# Patient Record
Sex: Male | Born: 1990 | Race: White | Hispanic: No | Marital: Single | Smoking: Current every day smoker
Health system: Southern US, Community
[De-identification: ages and names within clinical notes are randomized; demographics above are authoritative.]

## PROBLEM LIST (undated history)

## (undated) DIAGNOSIS — N451 Epididymitis: Secondary | ICD-10-CM

## (undated) DIAGNOSIS — J45909 Unspecified asthma, uncomplicated: Secondary | ICD-10-CM

## (undated) DIAGNOSIS — F32A Depression, unspecified: Secondary | ICD-10-CM

## (undated) DIAGNOSIS — F419 Anxiety disorder, unspecified: Secondary | ICD-10-CM

## (undated) DIAGNOSIS — F1010 Alcohol abuse, uncomplicated: Secondary | ICD-10-CM

## (undated) DIAGNOSIS — F329 Major depressive disorder, single episode, unspecified: Secondary | ICD-10-CM

## (undated) HISTORY — PX: NASAL SEPTUM SURGERY: SHX37

## (undated) HISTORY — DX: Anxiety disorder, unspecified: F41.9

---
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
## 2009-06-29 ENCOUNTER — Emergency Department (HOSPITAL_COMMUNITY): Admission: EM | Admit: 2009-06-29 | Discharge: 2009-06-30 | Payer: Self-pay | Admitting: Emergency Medicine

## 2009-06-30 ENCOUNTER — Ambulatory Visit: Payer: Self-pay | Admitting: Psychiatry

## 2009-06-30 ENCOUNTER — Inpatient Hospital Stay (HOSPITAL_COMMUNITY): Admission: AD | Admit: 2009-06-30 | Discharge: 2009-07-04 | Payer: Self-pay | Admitting: Psychiatry

## 2010-07-11 LAB — BASIC METABOLIC PANEL
CO2: 30 mEq/L (ref 19–32)
GFR calc Af Amer: >60 mL/min (ref >60)
Glucose, Bld: 62 mg/dL — ABNORMAL LOW (ref 70–99)
Potassium: 4 mEq/L (ref 3.5–5.1)
Sodium: 140 mEq/L (ref 135–145)

## 2010-07-11 LAB — DIFFERENTIAL
Basophils Absolute: 0 10*3/uL (ref 0.0–0.1)
Basophils Relative: 0 % (ref 0–1)
Eosinophils Relative: 4 % (ref 0–5)
Lymphocytes Relative: 39 % (ref 12–46)
Monocytes Absolute: 0.6 10*3/uL (ref 0.1–1.0)
Monocytes Relative: 7 % (ref 3–12)

## 2010-07-11 LAB — ETHANOL: Alcohol, Ethyl (B): <5 mg/dL (ref 0–10)

## 2010-07-11 LAB — TRICYCLICS SCREEN, URINE: TCA Scrn: NOT DETECTED

## 2010-07-11 LAB — CBC
HCT: 43.4 % (ref 39.0–52.0)
Hemoglobin: 14.1 g/dL (ref 13.0–17.0)
MCHC: 32.5 g/dL (ref 30.0–36.0)
RBC: 4.58 MIL/uL (ref 4.22–5.81)
RDW: 13.4 % (ref 11.5–15.5)

## 2010-07-11 LAB — RAPID URINE DRUG SCREEN, HOSP PERFORMED
Amphetamines: NOT DETECTED
Barbiturates: NOT DETECTED
Benzodiazepines: POSITIVE — AB
Cocaine: POSITIVE — AB
Opiates: NOT DETECTED
Tetrahydrocannabinol: NOT DETECTED

## 2010-09-03 NOTE — Op Note (Signed)
NAME:  Robert Dougherty, Robert Dougherty                ACCOUNT NO.:  [id number]   MEDICAL RECORD NO.:  [id number]          PATIENT TYPE:  AMB   LOCATION:  DSC                          FACILITY:  [facility]   PHYSICIAN:  Christopher E. 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   OPERATION PERFORMED:  Septoplasty and bilateral inferior turbinate  reduction.   SURGEON:  Kristine Garbe. Ezzard Standing, M.D.   ANESTHESIA:  General endotracheal.   COMPLICATIONS:  None.   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  On examination, he has deviation of the septum to the right with a large  septal spur on the right side.  Has large turbinates.  Nasal passages  revealed no polyps or obstructing lesions.  The patient was taken to the  operating room at this time for septoplasty and turbinate reductions.   DESCRIPTION OF PROCEDURE:  After adequate endotracheal anesthesia, the nose  was prepped with cotton pledgets soaked in Afrin for decongestion and the  septum and turbinates were injected with Xylocaine with epinephrine for  local hemostasis.  Nose was prepped with Betadine solution and draped out  with sterile towels.  A hemitransfixion incision was made along the  cartilage of the septum on the right side.  Mucoperiosteal flaps were  elevated posteriorly.  The patient had a protrusion of the bone to the right  along with cartilage protruding off the crest into the right airway. This  protrusion was removed and allowed the right passageway to open up much  more.  This completed the septoplasty portion of  the procedure.  Following  this, turbinate reductions were performed.  On the right side, the inferior  one half of the turbinate was amputated and the remaining turbinate tissue  was outfractured and suction cautery was used for hemostasis.  On the left  side an incision was made along the inferior edge of the turbinate.  Mucoperichondrial flaps were elevated off the turbinate bone and then the  turbinate bone was removed and the remaining turbinate tissue was  outfractured and cauterized.  Submucosal bipolar cautery was used on the  left side.  This completed the procedure.  The hemitransfixion incision was  closed with interrupted 4-0 chromic sutures, the septum was basted with a 4-  0 chromic suture.  The nose was then packed with Telfa soaked in bacitracin  ointment.  This completed the procedure. Robert Dougherty was awakened from anesthesia  and transferred to recovery room postoperatively doing well.  Of note, he  received 1 g Ancef IV preoperatively.   DISPOSITION:  Robert Dougherty is discharged to home later this morning on Keflex 500 mg  twice daily for one week, Tylenol and Tylenol #3 p.r.n. pain.  Have him  follow up in my office tomorrow to have his nasal packs removed.        CEN/MEDQ  D:  11/29/2004  T:  11/29/2004  Job:  [id number]

## 2011-07-07 ENCOUNTER — Encounter (HOSPITAL_COMMUNITY): Payer: Self-pay | Admitting: *Deleted

## 2011-07-07 ENCOUNTER — Emergency Department (HOSPITAL_COMMUNITY): Payer: Self-pay

## 2011-07-07 ENCOUNTER — Emergency Department (HOSPITAL_COMMUNITY)
Admission: EM | Admit: 2011-07-07 | Discharge: 2011-07-08 | Disposition: A | Discharge status: Home / Self Care | Payer: Self-pay | Attending: Emergency Medicine | Admitting: Emergency Medicine

## 2011-07-07 ENCOUNTER — Telehealth: Payer: Self-pay

## 2011-07-07 DIAGNOSIS — R51 Headache: Secondary | ICD-10-CM | POA: Insufficient documentation

## 2011-07-07 DIAGNOSIS — N453 Epididymo-orchitis: Secondary | ICD-10-CM | POA: Insufficient documentation

## 2011-07-07 DIAGNOSIS — R5381 Other malaise: Secondary | ICD-10-CM | POA: Insufficient documentation

## 2011-07-07 DIAGNOSIS — N390 Urinary tract infection, site not specified: Secondary | ICD-10-CM | POA: Insufficient documentation

## 2011-07-07 DIAGNOSIS — IMO0001 Reserved for inherently not codable concepts without codable children: Secondary | ICD-10-CM | POA: Insufficient documentation

## 2011-07-07 LAB — CBC
HCT: 40 % (ref 39.0–52.0)
Hemoglobin: 13.6 g/dL (ref 13.0–17.0)
MCH: 30.8 pg (ref 26.0–34.0)
MCHC: 34 g/dL (ref 30.0–36.0)
RDW: 12.6 % (ref 11.5–15.5)

## 2011-07-07 LAB — BASIC METABOLIC PANEL
BUN: 9 mg/dL (ref 6–23)
Calcium: 8.9 mg/dL (ref 8.4–10.5)
Chloride: 98 mEq/L (ref 96–112)
Creatinine, Ser: 0.83 mg/dL (ref 0.50–1.35)
GFR calc Af Amer: >90 mL/min (ref >90)
GFR calc non Af Amer: >90 mL/min (ref >90)

## 2011-07-07 LAB — DIFFERENTIAL
Basophils Absolute: 0 10*3/uL (ref 0.0–0.1)
Basophils Relative: 0 % (ref 0–1)
Eosinophils Absolute: 0.1 10*3/uL (ref 0.0–0.7)
Eosinophils Relative: 1 % (ref 0–5)
Monocytes Absolute: 0.9 10*3/uL (ref 0.1–1.0)
Monocytes Relative: 6 % (ref 3–12)
Neutro Abs: 12.2 10*3/uL — ABNORMAL HIGH (ref 1.7–7.7)

## 2011-07-07 MED ORDER — KETOROLAC TROMETHAMINE 30 MG/ML IJ SOLN
30.0000 mg | Freq: Once | INTRAMUSCULAR | Status: AC
Start: 2011-07-07 — End: 2011-07-07
  Administered 2011-07-07: 30 mg via INTRAVENOUS
  Filled 2011-07-07: qty 1

## 2011-07-07 MED ORDER — HYDROMORPHONE HCL PF 1 MG/ML IJ SOLN
1.0000 mg | Freq: Once | INTRAMUSCULAR | Status: AC
  Administered 2011-07-07: 1 mg via INTRAVENOUS
  Filled 2011-07-07: qty 1

## 2011-07-07 MED ORDER — ONDANSETRON HCL 4 MG/2ML IJ SOLN
4.0000 mg | Freq: Once | INTRAMUSCULAR | Status: AC
  Administered 2011-07-07: 4 mg via INTRAVENOUS
  Filled 2011-07-07: qty 2

## 2011-07-07 MED ORDER — HYDROMORPHONE HCL PF 1 MG/ML IJ SOLN
1.0000 mg | Freq: Once | INTRAMUSCULAR | Status: AC
  Administered 2011-07-08: 1 mg via INTRAVENOUS
  Filled 2011-07-07: qty 1

## 2011-07-07 MED ORDER — SODIUM CHLORIDE 0.9 % IV BOLUS (SEPSIS)
1000.0000 mL | Freq: Once | INTRAVENOUS | Status: AC
  Administered 2011-07-07: 1000 mL via INTRAVENOUS

## 2011-07-07 NOTE — ED Provider Notes (Signed)
History     CSN: [id number]  Arrival date & time 07/07/11  2053   First MD Initiated Contact with Patient 07/07/11 2259      Chief Complaint  Patient presents with  . Groin Pain  . Headache  . Nasal Congestion    (Consider location/radiation/quality/duration/timing/severity/associated sxs/prior treatment) Patient is a 21 y.o. male presenting with groin pain and headaches. The history is provided by the patient. No language interpreter was used.  Groin Pain This is a new problem. The current episode started more than 2 days ago (3 days ago - worse today with development of swelling). The problem occurs constantly. The problem has been gradually worsening. Associated symptoms include headaches. Pertinent negatives include no chest pain, no abdominal pain and no shortness of breath. The symptoms are aggravated by standing and walking. Relieved by: elevation of scrotum.  Headache  This is a new problem. The current episode started 2 days ago. The problem occurs constantly. The problem has not changed since onset.The headache is associated with nothing. The pain is located in the bilateral region. The quality of the pain is described as dull. The pain is moderate. The pain radiates to the right neck and left neck. Pertinent negatives include no anorexia, no fever, no malaise/fatigue, no palpitations, no shortness of breath, no nausea and no vomiting. He has tried nothing for the symptoms.    History reviewed. No pertinent past medical history.  History reviewed. No pertinent past surgical history.  History reviewed. No pertinent family history.  History  Substance Use Topics  . Smoking status: Not on file  . Smokeless tobacco: Not on file  . Alcohol Use: Not on file      Review of Systems  Constitutional: Positive for fatigue. Negative for fever, chills, malaise/fatigue, activity change and appetite change.  HENT: Negative for congestion, sore throat, rhinorrhea, neck pain and  neck stiffness.   Eyes: Negative for photophobia and visual disturbance.  Respiratory: Negative for cough and shortness of breath.   Cardiovascular: Negative for chest pain and palpitations.  Gastrointestinal: Negative for nausea, vomiting, abdominal pain and anorexia.  Genitourinary: Positive for scrotal swelling and testicular pain. Negative for dysuria, urgency, frequency, hematuria, flank pain, discharge and penile pain.  Musculoskeletal: Positive for myalgias. Negative for back pain and arthralgias.  Neurological: Positive for headaches. Negative for dizziness, weakness, light-headedness and numbness.  All other systems reviewed and are negative.    Allergies  Review of patient's allergies indicates no known allergies.  Home Medications   Current Outpatient Rx  Name Route Sig Dispense Refill  . B COMPLEX-C PO TABS Oral Take 1 tablet by mouth daily.    . ADULT MULTIVITAMIN W/MINERALS CH Oral Take 1 tablet by mouth daily.    Marland Kitchen CIPROFLOXACIN HCL 500 MG PO TABS Oral Take 1 tablet (500 mg total) by mouth 2 (two) times daily. 28 tablet 0  . NAPROXEN 500 MG PO TABS Oral Take 1 tablet (500 mg total) by mouth 2 (two) times daily. 30 tablet 0  . OXYCODONE-ACETAMINOPHEN 5-325 MG PO TABS Oral Take 1-2 tablets by mouth every 6 (six) hours as needed for pain. 20 tablet 0    BP 119/60  Pulse 76  Temp(Src) 98.5 F (36.9 C) (Oral)  Resp 14  SpO2 94%  Physical Exam  Nursing note and vitals reviewed. Constitutional: He is oriented to person, place, and time. He appears well-developed and well-nourished.       Uncomfortable in appearance  HENT:  Head: Normocephalic and  atraumatic.  Mouth/Throat: Oropharynx is clear and moist. No oropharyngeal exudate.  Eyes: Conjunctivae and EOM are normal. Pupils are equal, round, and reactive to light.  Neck: Normal range of motion. Neck supple.  Cardiovascular: Normal rate, regular rhythm, normal heart sounds and intact distal pulses.  Exam reveals no  gallop and no friction rub.   No murmur heard. Pulmonary/Chest: Effort normal and breath sounds normal. No respiratory distress. He exhibits no tenderness.  Abdominal: Soft. Bowel sounds are normal. There is no tenderness. Hernia confirmed negative in the right inguinal area and confirmed negative in the left inguinal area.  Genitourinary: Penis normal. Right testis shows swelling and tenderness. Right testis shows no mass. Left testis shows no mass, no swelling and no tenderness.  Musculoskeletal: Normal range of motion. He exhibits no tenderness.  Neurological: He is alert and oriented to person, place, and time. No cranial nerve deficit.  Skin: Skin is warm. No rash noted.    ED Course  Procedures (including critical care time)  Labs Reviewed  CBC - Abnormal; Notable for the following:    WBC 14.9 (*)    All other components within normal limits  DIFFERENTIAL - Abnormal; Notable for the following:    Neutrophils Relative 82 (*)    Neutro Abs 12.2 (*)    All other components within normal limits  BASIC METABOLIC PANEL - Abnormal; Notable for the following:    Sodium 133 (*)    All other components within normal limits  URINALYSIS, ROUTINE W REFLEX MICROSCOPIC - Abnormal; Notable for the following:    Protein, ur 30 (*)    Leukocytes, UA MODERATE (*)    All other components within normal limits  URINE MICROSCOPIC-ADD ON - Abnormal; Notable for the following:    Bacteria, UA MANY (*)    All other components within normal limits   US Scrotum  07/08/2011  *RADIOLOGY REPORT*  Clinical Data: Evaluate for testicular torsion  SCROTAL ULTRASOUND DOPPLER ULTRASOUND OF THE TESTICLES  Technique:  Complete ultrasound examination of the testicles, epididymis, and other scrotal structures was performed.  Color and spectral Doppler ultrasound were also utilized to evaluate blood flow to the testicles.  Comparison:  None.  Findings:  The testicles are symmetric in size and echogenicity. No  testicular masses are seen, and there is no evidence of microlithiasis.  Asymmetric enlargement of the right epididymis. There is no evidence of hydrocele, varicocele, or other extra- testicular abnormality.  Blood flow is seen within both testicles on color Doppler sonography.Increased blood flow to the right testis is noted. Doppler spectral waveforms show both arterial and venous flow signal in both testicles.  IMPRESSION:  1.  Negative for testicular mass or torsion. 2.  Increased blood flow to the right testis with enlargement of the right epididymis.  Findings suspicious for right-sided epididymitis and orchitis.  Original Report Authenticated By: Rosealee Albee, M.D.   [place] Art/ven Flow Abd Pelv Doppler  07/08/2011  *RADIOLOGY REPORT*  Clinical Data: Evaluate for testicular torsion  SCROTAL ULTRASOUND DOPPLER ULTRASOUND OF THE TESTICLES  Technique:  Complete ultrasound examination of the testicles, epididymis, and other scrotal structures was performed.  Color and spectral Doppler ultrasound were also utilized to evaluate blood flow to the testicles.  Comparison:  None.  Findings:  The testicles are symmetric in size and echogenicity. No testicular masses are seen, and there is no evidence of microlithiasis.  Asymmetric enlargement of the right epididymis. There is no evidence of hydrocele, varicocele, or other extra- testicular  abnormality.  Blood flow is seen within both testicles on color Doppler sonography.Increased blood flow to the right testis is noted. Doppler spectral waveforms show both arterial and venous flow signal in both testicles.  IMPRESSION:  1.  Negative for testicular mass or torsion. 2.  Increased blood flow to the right testis with enlargement of the right epididymis.  Findings suspicious for right-sided epididymitis and orchitis.  Original Report Authenticated By: Rosealee Albee, M.D.     1. UTI (lower urinary tract infection)   2. Epididymo - orchitis       MDM  Urinary  tract infection and epididymoorchitis. He was treated with Rocephin, Zithromax, Cipro, pain medication and fluids. He had improvement of his symptoms. Be discharged home with an ample amount of pain medication, anti-inflammatory medication. Encouraged to apply scrotal support. Instructed to followup with urology. He'll be discharged home with Cipro. Provided signs and symptoms for which to return        Dayton Bailiff, MD 07/08/11 [phone]

## 2011-07-07 NOTE — Telephone Encounter (Signed)
Pt would like for Dr Cleta Alberts or nurse to return the call he didn't leave any other details.Robert Dougherty

## 2011-07-07 NOTE — ED Notes (Signed)
Pt in c/o right groin pain into right testicle x3 days, also generalized illness, states headache and congestion and body aches, generalized weakness

## 2011-07-08 LAB — URINALYSIS, ROUTINE W REFLEX MICROSCOPIC
Bilirubin Urine: NEGATIVE
Ketones, ur: NEGATIVE mg/dL
Nitrite: NEGATIVE
Protein, ur: 30 mg/dL — AB
Urobilinogen, UA: 0.2 mg/dL (ref 0.0–1.0)
pH: 7 (ref 5.0–8.0)

## 2011-07-08 LAB — URINE MICROSCOPIC-ADD ON

## 2011-07-08 MED ORDER — AZITHROMYCIN 250 MG PO TABS
1000.0000 mg | ORAL_TABLET | Freq: Once | ORAL | Status: AC
Start: 2011-07-08 — End: 2011-07-08
  Administered 2011-07-08: 1000 mg via ORAL
  Filled 2011-07-08: qty 4

## 2011-07-08 MED ORDER — OXYCODONE-ACETAMINOPHEN 5-325 MG PO TABS: 1.0000 | ORAL_TABLET | Freq: Four times a day (QID) | ORAL | Status: AC | PRN

## 2011-07-08 MED ORDER — AZITHROMYCIN 250 MG PO TABS
ORAL_TABLET | ORAL | Status: AC
  Filled 2011-07-08: qty 1

## 2011-07-08 MED ORDER — CIPROFLOXACIN HCL 500 MG PO TABS
500.0000 mg | ORAL_TABLET | Freq: Once | ORAL | Status: AC
  Administered 2011-07-08: 500 mg via ORAL
  Filled 2011-07-08: qty 1

## 2011-07-08 MED ORDER — OXYCODONE-ACETAMINOPHEN 5-325 MG PO TABS
2.0000 | ORAL_TABLET | Freq: Once | ORAL | Status: AC
  Administered 2011-07-08: 2 via ORAL
  Filled 2011-07-08: qty 2

## 2011-07-08 MED ORDER — CIPROFLOXACIN HCL 500 MG PO TABS: 500.0000 mg | ORAL_TABLET | Freq: Two times a day (BID) | ORAL | Status: AC

## 2011-07-08 MED ORDER — DEXTROSE 5 % IV SOLN
1.0000 g | Freq: Once | INTRAVENOUS | Status: AC
  Administered 2011-07-08: 1 g via INTRAVENOUS
  Filled 2011-07-08: qty 10

## 2011-07-08 MED ORDER — NAPROXEN 500 MG PO TABS: 500.0000 mg | ORAL_TABLET | Freq: Two times a day (BID) | ORAL | Status: DC

## 2011-07-08 NOTE — ED Notes (Signed)
Pt c/o worsening pain during U/S exam. MD aware. Further orders received.

## 2011-07-08 NOTE — ED Notes (Signed)
Patient transported to US 

## 2011-07-08 NOTE — Discharge Instructions (Signed)
Epididymitis Epididymitis is a swelling (inflammation) of the epididymis. The epididymis is a cord-like structure along the back part of the testicle. Epididymitis is usually, but not always, caused by infection. This is usually a sudden problem beginning with chills, fever and pain behind the scrotum and in the testicle. There may be swelling and redness of the testicle. DIAGNOSIS  Physical examination will reveal a tender, swollen epididymis. Sometimes, cultures are obtained from the urine or from prostate secretions to help find out if there is an infection or if the cause is a different problem. Sometimes, blood work is performed to see if your white blood cell count is elevated and if a germ (bacterial) or viral infection is present. Using this knowledge, an appropriate medicine which kills germs (antibiotic) can be chosen by your caregiver. A viral infection causing epididymitis will most often go away (resolve) without treatment. HOME CARE INSTRUCTIONS   Hot sitz baths for 20 minutes, 4 times per day, may help relieve pain.   Only take over-the-counter or prescription medicines for pain, discomfort or fever as directed by your caregiver.   Take all medicines, including antibiotics, as directed. Take the antibiotics for the full prescribed length of time even if you are feeling better.   It is very important to keep all follow-up appointments.  SEEK IMMEDIATE MEDICAL CARE IF:   You have a fever.   You have pain not relieved with medicines.   You have any worsening of your problems.   Your pain seems to come and go.   You develop pain, redness, and swelling in the scrotum and surrounding areas.  MAKE SURE YOU:   Understand these instructions.   Will watch your condition.   Will get help right away if you are not doing well or get worse.  Document Released: 04/01/2000 Document Revised: 03/24/2011 Document Reviewed: 02/19/2009 [facility] Patient Information 2012 [place],  [place].  Orchitis Orchitis is an infection of the testicle of usually sudden onset (happens quickly). It may be viral or bacterial (caused by germs). Usually with this illness there is generalized malaise (not feeling well) and fever. There is also pain. There is usually tenderness and swelling of the scrotum and testicle. DIAGNOSIS  Your caregiver will perform an exam to make sure there is not another reason for the pain in your testicle. A rectal exam may be done to find out if the prostate is swollen and tender. Blood work may be done to see if your white blood cell count is elevated. This can help determine if an infection is viral or bacterial. A urinalysis can also determine what type of infection is present. Most bacterial infections can be treated with antibiotics (medications which kill germs). LET YOUR CAREGIVER KNOW ABOUT:  Allergies.   Medications taken including herbs, eye drops, over the counter medications, and creams.   Use of steroids (by mouth or creams).   Previous problems with anesthetics or novocaine.   Previous prostate infections.   History of blood clots (thrombophlebitis).   History of bleeding or blood problems.   Previous surgery.   Previous urinary tract infection.   Other health problems.  HOME CARE INSTRUCTIONS   Apply cold packs to the scrotal area for twenty minutes, four times per day or as needed.   A scrotal support may be helpful. Keep a small pillow or support under your testicles while lying or sitting down.   Only take over-the-counter or prescription medicines for pain, discomfort, or fever as directed by your caregiver.  Take all medications, including antibiotics, as directed. Take the antibiotics for the full prescribed length of time even if you are feeling better.  SEEK IMMEDIATE MEDICAL CARE IF:   Your redness, swelling, or pain in the testicle increases or is not getting better.   You have a fever.   You have pain not relieved  with medicines.   You have any worsening of any symptoms (problems) that originally brought you in for medical care.  Document Released: 04/01/2000 Document Revised: 03/24/2011 Document Reviewed: 04/04/2005 [facility] Patient Information 2012 [place], [place].  Urinary Tract Infection Infections of the urinary tract can start in several places. A bladder infection (cystitis), a kidney infection (pyelonephritis), and a prostate infection (prostatitis) are different types of urinary tract infections (UTIs). They usually get better if treated with medicines (antibiotics) that kill germs. Take all the medicine until it is gone. You or your child may feel better in a few days, but TAKE ALL MEDICINE or the infection may not respond and may become more difficult to treat. HOME CARE INSTRUCTIONS   Drink enough water and fluids to keep the urine clear or pale yellow. Cranberry juice is especially recommended, in addition to large amounts of water.   Avoid caffeine, tea, and carbonated beverages. They tend to irritate the bladder.   Alcohol may irritate the prostate.   Only take over-the-counter or prescription medicines for pain, discomfort, or fever as directed by your caregiver.  To prevent further infections:  Empty the bladder often. Avoid holding urine for long periods of time.   After a bowel movement, women should cleanse from front to back. Use each tissue only once.   Empty the bladder before and after sexual intercourse.  FINDING OUT THE RESULTS OF YOUR TEST Not all test results are available during your visit. If your or your child's test results are not back during the visit, make an appointment with your caregiver to find out the results. Do not assume everything is normal if you have not heard from your caregiver or the medical facility. It is important for you to follow up on all test results. SEEK MEDICAL CARE IF:   There is back pain.   Your baby is older than 3 months with a  rectal temperature of 100.5 F (38.1 C) or higher for more than 1 day.   Your or your child's problems (symptoms) are no better in 3 days. Return sooner if you or your child is getting worse.  SEEK IMMEDIATE MEDICAL CARE IF:   There is severe back pain or lower abdominal pain.   You or your child develops chills.   You have a fever.   Your baby is older than 3 months with a rectal temperature of 102 F (38.9 C) or higher.   Your baby is 75 months old or younger with a rectal temperature of 100.4 F (38 C) or higher.   There is nausea or vomiting.   There is continued burning or discomfort with urination.  MAKE SURE YOU:   Understand these instructions.   Will watch your condition.   Will get help right away if you are not doing well or get worse.  Document Released: 01/12/2005 Document Revised: 03/24/2011 Document Reviewed: 08/17/2006 [facility] Patient Information 2012 [place], [place].

## 2011-07-08 NOTE — ED Notes (Signed)
MD at bedside.  Dr. King at bedside 

## 2011-08-17 ENCOUNTER — Encounter (HOSPITAL_COMMUNITY): Payer: Self-pay | Admitting: *Deleted

## 2011-08-17 ENCOUNTER — Emergency Department (HOSPITAL_COMMUNITY)
Admission: EM | Admit: 2011-08-17 | Discharge: 2011-08-17 | Disposition: A | Discharge status: Home / Self Care | Payer: Self-pay | Attending: Emergency Medicine | Admitting: Emergency Medicine

## 2011-08-17 DIAGNOSIS — F1110 Opioid abuse, uncomplicated: Secondary | ICD-10-CM | POA: Insufficient documentation

## 2011-08-17 DIAGNOSIS — L259 Unspecified contact dermatitis, unspecified cause: Secondary | ICD-10-CM | POA: Insufficient documentation

## 2011-08-17 DIAGNOSIS — L309 Dermatitis, unspecified: Secondary | ICD-10-CM

## 2011-08-17 MED ORDER — PREDNISONE 20 MG PO TABS
60.0000 mg | ORAL_TABLET | Freq: Once | ORAL | Status: AC
Start: 2011-08-17 — End: 2011-08-17
  Administered 2011-08-17: 60 mg via ORAL
  Filled 2011-08-17: qty 3

## 2011-08-17 MED ORDER — DIPHENHYDRAMINE HCL 25 MG PO CAPS
50.0000 mg | ORAL_CAPSULE | Freq: Once | ORAL | Status: AC
  Administered 2011-08-17: 50 mg via ORAL
  Filled 2011-08-17: qty 2

## 2011-08-17 MED ORDER — PREDNISONE 50 MG PO TABS: ORAL_TABLET | ORAL | Status: AC

## 2011-08-17 MED ORDER — DIPHENHYDRAMINE HCL 25 MG PO TABS: 25.0000 mg | ORAL_TABLET | Freq: Four times a day (QID) | ORAL | Status: DC

## 2011-08-17 NOTE — Discharge Instructions (Signed)
Take benadryl as directed for itch. Stop scratching rash. Take prednisone as directed. You may consider an over the counter oatmeal bath for additional itch relief. Follow up with the dermatology referral for further evaluation and management of ongoing rash. STOP using heroine.   Contact Dermatitis Contact dermatitis is a reaction to certain substances that touch the skin. Contact dermatitis can be either irritant contact dermatitis or allergic contact dermatitis. Irritant contact dermatitis does not require previous exposure to the substance for a reaction to occur.Allergic contact dermatitis only occurs if you have been exposed to the substance before. Upon a repeat exposure, your body reacts to the substance.  CAUSES  Many substances can cause contact dermatitis. Irritant dermatitis is most commonly caused by repeated exposure to mildly irritating substances, such as:  Makeup.   Soaps.   Detergents.   Bleaches.   Acids.   Metal salts, such as nickel.  Allergic contact dermatitis is most commonly caused by exposure to:  Poisonous plants.   Chemicals (deodorants, shampoos).   Jewelry.   Latex.   Neomycin in triple antibiotic cream.   Preservatives in products, including clothing.  SYMPTOMS  The area of skin that is exposed may develop:  Dryness or flaking.   Redness.   Cracks.   Itching.   Pain or a burning sensation.   Blisters.  With allergic contact dermatitis, there may also be swelling in areas such as the eyelids, mouth, or genitals.  DIAGNOSIS  Your caregiver can usually tell what the problem is by doing a physical exam. In cases where the cause is uncertain and an allergic contact dermatitis is suspected, a patch skin test may be performed to help determine the cause of your dermatitis. TREATMENT Treatment includes protecting the skin from further contact with the irritating substance by avoiding that substance if possible. Barrier creams, powders, and  gloves may be helpful. Your caregiver may also recommend:  Steroid creams or ointments applied 2 times daily. For best results, soak the rash area in cool water for 20 minutes. Then apply the medicine. Cover the area with a plastic wrap. You can store the steroid cream in the refrigerator for a "chilly" effect on your rash. That may decrease itching. Oral steroid medicines may be needed in more severe cases.   Antibiotics or antibacterial ointments if a skin infection is present.   Antihistamine lotion or an antihistamine taken by mouth to ease itching.   Lubricants to keep moisture in your skin.   Burow's solution to reduce redness and soreness or to dry a weeping rash. Mix one packet or tablet of solution in 2 cups cool water. Dip a clean washcloth in the mixture, wring it out a bit, and put it on the affected area. Leave the cloth in place for 30 minutes. Do this as often as possible throughout the day.   Taking several cornstarch or baking soda baths daily if the area is too large to cover with a washcloth.  Harsh chemicals, such as alkalis or acids, can cause skin damage that is like a burn. You should flush your skin for 15 to 20 minutes with cold water after such an exposure. You should also seek immediate medical care after exposure. Bandages (dressings), antibiotics, and pain medicine may be needed for severely irritated skin.  HOME CARE INSTRUCTIONS  Avoid the substance that caused your reaction.   Keep the area of skin that is affected away from hot water, soap, sunlight, chemicals, acidic substances, or anything else that would  irritate your skin.   Do not scratch the rash. Scratching may cause the rash to become infected.   You may take cool baths to help stop the itching.   Only take over-the-counter or prescription medicines as directed by your caregiver.   See your caregiver for follow-up care as directed to make sure your skin is healing properly.  SEEK MEDICAL CARE IF:     Your condition is not better after 3 days of treatment.   You seem to be getting worse.   You see signs of infection such as swelling, tenderness, redness, soreness, or warmth in the affected area.   You have any problems related to your medicines.  Document Released: 04/01/2000 Document Revised: 03/24/2011 Document Reviewed: 09/07/2010 [facility] Patient Information 2012 [place], [place].

## 2011-08-17 NOTE — ED Notes (Signed)
Pt was seen by Drucie Opitz, PA and discharged from triage.

## 2011-08-17 NOTE — ED Provider Notes (Signed)
History     CSN: [id number]  Arrival date & time 08/17/11  1449   None     Chief Complaint  Patient presents with  . Rash    (Consider location/radiation/quality/duration/timing/severity/associated sxs/prior treatment) HPI  Patient presents to ER complaining of a one week hx of total over all body itching and a few day hx of diffuse pruritic rash. Patient states "I was watching the news and heard there was a measles outbreak and that they had a rash so I got worried." patient is not aware of his immunization status but "I know I got some vaccinations and not others" but denies any known sick contacts or known measles exposure. He denies fevers or chills but states "I've been trying to quit heroine so I sometimes feel like I have the chills." Patient states he last shot heroine this morning. Patient states he has recently spent a lot of time in the woods and outdoors doing yard work but does not have known poison ivy/oak exposure. Denies facial swelling, difficulty breathing/swallowing, cough or wheezing. Patient states hx of eczema. No recent detergent or skin product changes. He denies easy bruising.   History reviewed. No pertinent past medical history.  Past Surgical History  Procedure Date  . Nasal septum surgery     History reviewed. No pertinent family history.  History  Substance Use Topics  . Smoking status: Current Everyday Smoker -- 0.5 packs/day    Types: Cigarettes  . Smokeless tobacco: Not on file  . Alcohol Use: Yes      Review of Systems  All other systems reviewed and are negative.    Allergies  Review of patient's allergies indicates no known allergies.  Home Medications   Current Outpatient Rx  Name Route Sig Dispense Refill  . AMPHETAMINE-DEXTROAMPHETAMINE 30 MG PO TABS Oral Take 30 mg by mouth daily.    Marland Kitchen VITAMIN B COMPLEX PO Oral Take 1 tablet by mouth daily.    Marland Kitchen BUPRENORPHINE HCL-NALOXONE HCL 8-2 MG SL SUBL Sublingual Place 0.5-1 tablets under  the tongue daily.    Marland Kitchen DIAZEPAM 10 MG PO TABS Oral Take 10 mg by mouth every 8 (eight) hours as needed. For anxiety.    . ADULT MULTIVITAMIN W/MINERALS CH Oral Take 1 tablet by mouth daily.      BP 136/81  Pulse 96  Temp(Src) 97.6 F (36.4 C) (Oral)  Resp 20  SpO2 100%  Physical Exam  Nursing note and vitals reviewed. Constitutional: He is oriented to person, place, and time. He appears well-developed and well-nourished. No distress.  HENT:  Head: Normocephalic and atraumatic.  Eyes: Conjunctivae are normal.  Neck: Normal range of motion. Neck supple.  Cardiovascular: Normal rate, regular rhythm, normal heart sounds and intact distal pulses.  Exam reveals no gallop and no friction rub.   No murmur heard. Pulmonary/Chest: Effort normal and breath sounds normal. No respiratory distress. He has no wheezes. He has no rales. He exhibits no tenderness.  Abdominal: Soft. Bowel sounds are normal. He exhibits no distension and no mass. There is no tenderness. There is no rebound and no guarding.  Musculoskeletal: Normal range of motion. He exhibits no edema and no tenderness.  Lymphadenopathy:    He has no cervical adenopathy.  Neurological: He is alert and oriented to person, place, and time.  Skin: Skin is warm and dry. Rash noted. He is not diaphoretic. No erythema.       Diffuse pen point faintly erythematous sand paper like rash on extremities,  thighs and trunk. Excoriations from scratching but no drainage. No soft tissue edema.   Psychiatric: He has a normal mood and affect.    ED Course  Procedures (including critical care time)  PO benadryl and prednisone.   Labs Reviewed - No data to display No results found.   1. Dermatitis   2. Heroin abuse       MDM  This is not the measles. Patient has pruritic rash but no signs or symptoms of anaphylaxis or infectious rash. Question contact dermatitis very recent outdoor activity. Patient states that he may actually have had MMR  as a child and has not had recent sick contacts or known measles exposure.         Jenness Corner, [place] 08/17/11 [phone]

## 2011-08-17 NOTE — ED Notes (Signed)
Pt reports rash all over his body x 1 week, pt reports fever but does not know what it is. Pt reports he might have measles.

## 2011-08-21 NOTE — ED Provider Notes (Signed)
Medical screening examination/treatment/procedure(s) were performed by non-physician practitioner and as supervising physician I was immediately available for consultation/collaboration.  Toy Baker, MD 08/21/11 1046

## 2011-08-23 ENCOUNTER — Encounter (HOSPITAL_COMMUNITY): Payer: Self-pay | Admitting: *Deleted

## 2011-08-23 ENCOUNTER — Emergency Department (HOSPITAL_COMMUNITY)
Admission: EM | Admit: 2011-08-23 | Discharge: 2011-08-23 | Disposition: A | Discharge status: Home / Self Care | Payer: Self-pay | Attending: Emergency Medicine | Admitting: Emergency Medicine

## 2011-08-23 DIAGNOSIS — R21 Rash and other nonspecific skin eruption: Secondary | ICD-10-CM | POA: Insufficient documentation

## 2011-08-23 NOTE — ED Notes (Signed)
Pt in c/o generalized rash, last seen a week ago for the same and states he was given steroids without relief

## 2011-08-23 NOTE — ED Notes (Signed)
Pt stated the wait was too long

## 2011-08-25 ENCOUNTER — Emergency Department (HOSPITAL_COMMUNITY)
Admission: EM | Admit: 2011-08-25 | Discharge: 2011-08-25 | Disposition: A | Discharge status: Home / Self Care | Payer: Self-pay | Attending: Emergency Medicine | Admitting: Emergency Medicine

## 2011-08-25 ENCOUNTER — Encounter (HOSPITAL_COMMUNITY): Payer: Self-pay | Admitting: Family Medicine

## 2011-08-25 DIAGNOSIS — B86 Scabies: Secondary | ICD-10-CM | POA: Insufficient documentation

## 2011-08-25 DIAGNOSIS — F17200 Nicotine dependence, unspecified, uncomplicated: Secondary | ICD-10-CM | POA: Insufficient documentation

## 2011-08-25 MED ORDER — HYDROXYZINE HCL 25 MG PO TABS
25.0000 mg | ORAL_TABLET | Freq: Once | ORAL | Status: AC
  Administered 2011-08-25: 25 mg via ORAL
  Filled 2011-08-25: qty 1

## 2011-08-25 MED ORDER — HYDROXYZINE HCL 25 MG PO TABS: 25.0000 mg | ORAL_TABLET | Freq: Four times a day (QID) | ORAL | Status: AC

## 2011-08-25 MED ORDER — PERMETHRIN 5 % EX CREA: TOPICAL_CREAM | CUTANEOUS | Status: AC

## 2011-08-25 NOTE — Discharge Instructions (Signed)
Your rash appears likely consistent with scabies. You have been given a prescription for permethrin, a cream to apply. Use this once; you may repeat if your symptoms persist. Use the Atarax for itching. Make sure you wash your clothing and bedding in hot water. Return to the ED for worsening condition.  Scabies Scabies are small bugs (mites) that burrow under the skin and cause red bumps and severe itching. These bugs can only be seen with a microscope. Scabies are highly contagious. They can spread easily from person to person by direct contact. They are also spread through sharing clothing or linens that have the scabies mites living in them. It is not unusual for an entire family to become infected through shared towels, clothing, or bedding.  HOME CARE INSTRUCTIONS   Your caregiver may prescribe a cream or lotion to kill the mites. If this cream is prescribed; massage the cream into the entire area of the body from the neck to the bottom of both feet. Also massage the cream into the scalp and face if your child is less than 89 year old. Avoid the eyes and mouth.   Leave the cream on for 8 to12 hours. Do not wash your hands after application. Your child should bathe or shower after the 8 to 12 hour application period. Sometimes it is helpful to apply the cream to your child at right before bedtime.   One treatment is usually effective and will eliminate approximately 95% of infestations. For severe cases, your caregiver may decide to repeat the treatment in 1 week. Everyone in your household should be treated with one application of the cream.   New rashes or burrows should not appear after successful treatment within 24 to 48 hours; however the itching and rash may last for 2 to 4 weeks after successful treatment. If your symptoms persist longer than this, see your caregiver.   Your caregiver also may prescribe a medication to help with the itching or to help the rash go away more quickly.    Scabies can live on clothing or linens for up to 3 days. Your entire child's recently used clothing, towels, stuffed toys, and bed linens should be washed in hot water and then dried in a dryer for at least 20 minutes on high heat. Items that cannot be washed should be enclosed in a plastic bag for at least 3 days.   To help relieve itching, bathe your child in a cool bath or apply cool washcloths to the affected areas.   Your child may return to school after treatment with the prescribed cream.  SEEK MEDICAL CARE IF:   The itching persists longer than 4 weeks after treatment.   The rash spreads or becomes infected (the area has red blisters or yellow-tan crust).  Document Released: 04/04/2005 Document Revised: 03/24/2011 Document Reviewed: 08/13/2008 [facility] Patient Information 2012 [place], [place].

## 2011-08-25 NOTE — ED Notes (Signed)
Patient states "I think I have scabies." Was seen here a week ago. States rash and itching has gotten worse.

## 2011-08-25 NOTE — ED Provider Notes (Signed)
History     CSN: [id number]  Arrival date & time 08/25/11  1903   First MD Initiated Contact with Patient 08/25/11 2006      Chief Complaint  Patient presents with  . Rash    (Consider location/radiation/quality/duration/timing/severity/associated sxs/prior treatment) HPI History from patient. 21 year old male who presents with rash. States he was seen here for the same about a week ago and given prescription for prednisone, which did not help. States several of his friends have had identical rashes. One of the friends was seen and told that he likely had scabies. He was given a prescription for permethrin and had complete improvement with this; he is the only one with symptoms who has had improvement.  The patient states that his lesions have spread and he has had considerably increased pruritus since being seen last week. He has been taking Benadryl with little relief. He denies any fever or chills. He has noted no drainage from the areas. Denies any chest pain, abdominal pain, nausea, vomiting, change in bowel movements. Denies any difficulty breathing or swallowing, facial swelling, cough, congestion. He does have a history of eczema. Denies recent known environmental exposures. Patient does state that he is currently trying to quit heroin.  History reviewed. No pertinent past medical history.  Past Surgical History  Procedure Date  . Nasal septum surgery     No family history on file.  History  Substance Use Topics  . Smoking status: Current Everyday Smoker -- 0.5 packs/day    Types: Cigarettes  . Smokeless tobacco: Not on file  . Alcohol Use: Yes      Review of Systems as per history of present illness   Allergies  Review of patient's allergies indicates no known allergies.  Home Medications   Current Outpatient Rx  Name Route Sig Dispense Refill  . VITAMIN B COMPLEX PO Oral Take 1 tablet by mouth daily.    Marland Kitchen PREDNISONE 50 MG PO TABS  Take 1 tablet by mouth daily  for total of 5 days. 5 tablet 0    BP 116/69  Pulse 89  Temp(Src) 99.1 F (37.3 C) (Oral)  Resp 20  SpO2 99%  Physical Exam  Nursing note and vitals reviewed. Constitutional: He appears well-developed and well-nourished. No distress.       Patient moderately uncomfortable appearing, continuously scratching at lesions  HENT:  Head: Normocephalic and atraumatic.  Neck: Normal range of motion.  Cardiovascular: Normal rate, regular rhythm and normal heart sounds.   Pulmonary/Chest: Effort normal and breath sounds normal. He exhibits no tenderness.  Abdominal: Soft. There is no tenderness. There is no rebound.  Musculoskeletal: Normal range of motion.  Neurological: He is alert.  Skin: Skin is warm and dry. He is not diaphoretic.       Macular erythematous rash located diffusely, worst on forearms and hands, trunk. Appears c/w insect bites. No facial involvement noted. Patient has excoriations from scratching. Several burrow marks noted, between the web spaces.  Psychiatric: He has a normal mood and affect.    ED Course  Procedures (including critical care time)  Labs Reviewed - No data to display No results found.   1. Scabies       MDM  Patient presents with rash. Has been seen for this before and prescribed prednisone with no improvement. He has a friend with identical symptoms, who was treated for scabies and had complete resolution. Based on this, we will give him a procedure for permethrin. Prescription for Atarax given  as well. Reasons to return to the ED discussed. Patient verbalized understanding and was agreeable to plan.        Grant Fontana, [place] 08/26/11 [phone]

## 2011-08-28 NOTE — ED Provider Notes (Signed)
Medical screening examination/treatment/procedure(s) were performed by non-physician practitioner and as supervising physician I was immediately available for consultation/collaboration.  Jasamine Pottinger, MD 08/28/11 1106 

## 2011-11-22 ENCOUNTER — Emergency Department (HOSPITAL_BASED_OUTPATIENT_CLINIC_OR_DEPARTMENT_OTHER)
Admission: EM | Admit: 2011-11-22 | Discharge: 2011-11-23 | Disposition: A | Discharge status: Home / Self Care | Payer: Self-pay | Attending: Emergency Medicine | Admitting: Emergency Medicine

## 2011-11-22 ENCOUNTER — Emergency Department (HOSPITAL_BASED_OUTPATIENT_CLINIC_OR_DEPARTMENT_OTHER): Payer: Self-pay

## 2011-11-22 ENCOUNTER — Encounter (HOSPITAL_BASED_OUTPATIENT_CLINIC_OR_DEPARTMENT_OTHER): Payer: Self-pay | Admitting: *Deleted

## 2011-11-22 ENCOUNTER — Emergency Department (HOSPITAL_COMMUNITY): Admission: EM | Admit: 2011-11-22 | Payer: Self-pay | Source: Home / Self Care

## 2011-11-22 DIAGNOSIS — N453 Epididymo-orchitis: Secondary | ICD-10-CM | POA: Insufficient documentation

## 2011-11-22 DIAGNOSIS — N451 Epididymitis: Secondary | ICD-10-CM

## 2011-11-22 DIAGNOSIS — F17200 Nicotine dependence, unspecified, uncomplicated: Secondary | ICD-10-CM | POA: Insufficient documentation

## 2011-11-22 HISTORY — DX: Epididymitis: N45.1

## 2011-11-22 LAB — URINALYSIS, ROUTINE W REFLEX MICROSCOPIC
Nitrite: NEGATIVE
Specific Gravity, Urine: 1.01 (ref 1.005–1.030)
Urobilinogen, UA: 0.2 mg/dL (ref 0.0–1.0)

## 2011-11-22 MED ORDER — CEFTRIAXONE SODIUM 250 MG IJ SOLR
250.0000 mg | Freq: Once | INTRAMUSCULAR | Status: AC
  Administered 2011-11-23: 250 mg via INTRAMUSCULAR
  Filled 2011-11-22: qty 250

## 2011-11-22 MED ORDER — KETOROLAC TROMETHAMINE 30 MG/ML IJ SOLN
30.0000 mg | Freq: Once | INTRAMUSCULAR | Status: AC
  Administered 2011-11-22: 30 mg via INTRAVENOUS
  Filled 2011-11-22: qty 1

## 2011-11-22 MED ORDER — DOXYCYCLINE HYCLATE 100 MG PO TABS
100.0000 mg | ORAL_TABLET | Freq: Once | ORAL | Status: AC
  Administered 2011-11-23: 100 mg via ORAL
  Filled 2011-11-22: qty 1

## 2011-11-22 MED ORDER — HYDROMORPHONE HCL PF 1 MG/ML IJ SOLN
1.0000 mg | Freq: Once | INTRAMUSCULAR | Status: AC
  Administered 2011-11-22: 1 mg via INTRAVENOUS
  Filled 2011-11-22: qty 1

## 2011-11-22 NOTE — ED Notes (Signed)
Pt seen at [facility] tonight left AMA.  Here for same, right scrotum pain
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
## 2011-11-23 LAB — GC/CHLAMYDIA PROBE AMP, GENITAL
Chlamydia, DNA Probe: NEGATIVE
GC Probe Amp, Genital: NEGATIVE

## 2011-11-23 MED ORDER — TRAMADOL HCL 50 MG PO TABS: ORAL_TABLET | ORAL | Status: DC

## 2011-11-23 MED ORDER — TRAMADOL HCL 50 MG PO TABS
50.0000 mg | ORAL_TABLET | Freq: Once | ORAL | Status: AC
  Administered 2011-11-23: 50 mg via ORAL
  Filled 2011-11-23: qty 1

## 2011-11-23 MED ORDER — LIDOCAINE HCL (PF) 1 % IJ SOLN
INTRAMUSCULAR | Status: AC
  Administered 2011-11-23: 5 mL
  Filled 2011-11-23: qty 5

## 2011-11-23 MED ORDER — IBUPROFEN 800 MG PO TABS: 800.0000 mg | ORAL_TABLET | Freq: Three times a day (TID) | ORAL | Status: AC

## 2011-11-23 MED ORDER — DOXYCYCLINE HYCLATE 100 MG PO TABS: 100.0000 mg | ORAL_TABLET | Freq: Two times a day (BID) | ORAL | Status: AC

## 2012-01-20 ENCOUNTER — Emergency Department (HOSPITAL_COMMUNITY)
Admission: EM | Admit: 2012-01-20 | Discharge: 2012-01-21 | Disposition: A | Discharge status: Home / Self Care | Payer: Self-pay | Attending: Emergency Medicine | Admitting: Emergency Medicine

## 2012-01-20 ENCOUNTER — Encounter (HOSPITAL_COMMUNITY): Payer: Self-pay

## 2012-01-20 DIAGNOSIS — R51 Headache: Secondary | ICD-10-CM | POA: Insufficient documentation

## 2012-01-20 DIAGNOSIS — F1010 Alcohol abuse, uncomplicated: Secondary | ICD-10-CM | POA: Insufficient documentation

## 2012-01-20 DIAGNOSIS — F1110 Opioid abuse, uncomplicated: Secondary | ICD-10-CM | POA: Insufficient documentation

## 2012-01-20 HISTORY — DX: Major depressive disorder, single episode, unspecified: F32.9

## 2012-01-20 HISTORY — DX: Depression, unspecified: F32.A

## 2012-01-20 HISTORY — DX: Alcohol abuse, uncomplicated: F10.10

## 2012-01-20 LAB — RAPID URINE DRUG SCREEN, HOSP PERFORMED
Amphetamines: NOT DETECTED
Barbiturates: NOT DETECTED
Benzodiazepines: NOT DETECTED
Cocaine: NOT DETECTED
Opiates: POSITIVE — AB
Tetrahydrocannabinol: NOT DETECTED

## 2012-01-20 LAB — URINALYSIS, ROUTINE W REFLEX MICROSCOPIC
Bilirubin Urine: NEGATIVE
Glucose, UA: NEGATIVE mg/dL
Hgb urine dipstick: NEGATIVE
Ketones, ur: NEGATIVE mg/dL
Leukocytes, UA: NEGATIVE
Nitrite: NEGATIVE
Protein, ur: NEGATIVE mg/dL
Specific Gravity, Urine: 1.018 (ref 1.005–1.030)
Urobilinogen, UA: 0.2 mg/dL (ref 0.0–1.0)
pH: 6.5 (ref 5.0–8.0)

## 2012-01-20 LAB — COMPREHENSIVE METABOLIC PANEL
ALT: 15 U/L (ref 0–53)
AST: 22 U/L (ref 0–37)
Albumin: 4 g/dL (ref 3.5–5.2)
Alkaline Phosphatase: 57 U/L (ref 39–117)
CO2: 28 mEq/L (ref 19–32)
Chloride: 102 mEq/L (ref 96–112)
Creatinine, Ser: 0.96 mg/dL (ref 0.50–1.35)
GFR calc non Af Amer: >90 mL/min (ref >90)
Potassium: 4.5 mEq/L (ref 3.5–5.1)
Total Bilirubin: 0.4 mg/dL (ref 0.3–1.2)

## 2012-01-20 LAB — CBC WITH DIFFERENTIAL/PLATELET
Basophils Absolute: 0 10*3/uL (ref 0.0–0.1)
HCT: 42.1 % (ref 39.0–52.0)
Hemoglobin: 14.5 g/dL (ref 13.0–17.0)
Lymphocytes Relative: 26 % (ref 12–46)
Monocytes Absolute: 0.6 10*3/uL (ref 0.1–1.0)
Neutro Abs: 4.5 10*3/uL (ref 1.7–7.7)
Neutrophils Relative %: 62 % (ref 43–77)
RDW: 12.8 % (ref 11.5–15.5)
WBC: 7.2 10*3/uL (ref 4.0–10.5)

## 2012-01-20 MED ORDER — LOPERAMIDE HCL 2 MG PO CAPS: 2.0000 mg | ORAL_CAPSULE | ORAL | Status: DC | PRN

## 2012-01-20 MED ORDER — CLONIDINE HCL 0.1 MG PO TABS: 0.1000 mg | ORAL_TABLET | ORAL | Status: DC

## 2012-01-20 MED ORDER — BUPROPION HCL ER (SR) 150 MG PO TB12
150.0000 mg | ORAL_TABLET | Freq: Two times a day (BID) | ORAL | Status: DC
  Administered 2012-01-21: 150 mg via ORAL
  Filled 2012-01-20 (×4): qty 1

## 2012-01-20 MED ORDER — VITAMIN B-1 100 MG PO TABS
100.0000 mg | ORAL_TABLET | Freq: Every day | ORAL | Status: DC
  Administered 2012-01-20 – 2012-01-21 (×2): 100 mg via ORAL
  Filled 2012-01-20 (×2): qty 1

## 2012-01-20 MED ORDER — ALUM & MAG HYDROXIDE-SIMETH 200-200-20 MG/5ML PO SUSP: 30.0000 mL | ORAL | Status: DC | PRN

## 2012-01-20 MED ORDER — ACETAMINOPHEN 325 MG PO TABS: 650.0000 mg | ORAL_TABLET | ORAL | Status: DC | PRN

## 2012-01-20 MED ORDER — LORAZEPAM 1 MG PO TABS: 1.0000 mg | ORAL_TABLET | Freq: Four times a day (QID) | ORAL | Status: DC | PRN

## 2012-01-20 MED ORDER — LORAZEPAM 1 MG PO TABS: 1.0000 mg | ORAL_TABLET | Freq: Three times a day (TID) | ORAL | Status: DC | PRN

## 2012-01-20 MED ORDER — NICOTINE 21 MG/24HR TD PT24
21.0000 mg | MEDICATED_PATCH | Freq: Every day | TRANSDERMAL | Status: DC
  Administered 2012-01-20: 21 mg via TRANSDERMAL
  Filled 2012-01-20: qty 1

## 2012-01-20 MED ORDER — NAPROXEN 500 MG PO TABS: 500.0000 mg | ORAL_TABLET | Freq: Two times a day (BID) | ORAL | Status: DC | PRN

## 2012-01-20 MED ORDER — ZOLPIDEM TARTRATE 5 MG PO TABS
5.0000 mg | ORAL_TABLET | Freq: Every evening | ORAL | Status: DC | PRN
  Filled 2012-01-20: qty 1

## 2012-01-20 MED ORDER — LORAZEPAM 1 MG PO TABS
0.0000 mg | ORAL_TABLET | Freq: Four times a day (QID) | ORAL | Status: DC
  Administered 2012-01-20 – 2012-01-21 (×2): 1 mg via ORAL
  Filled 2012-01-20 (×2): qty 1

## 2012-01-20 MED ORDER — CLONIDINE HCL 0.1 MG PO TABS
0.1000 mg | ORAL_TABLET | Freq: Four times a day (QID) | ORAL | Status: DC
  Administered 2012-01-20 – 2012-01-21 (×4): 0.1 mg via ORAL
  Filled 2012-01-20 (×4): qty 1

## 2012-01-20 MED ORDER — ADULT MULTIVITAMIN W/MINERALS CH
1.0000 | ORAL_TABLET | Freq: Every day | ORAL | Status: DC
  Administered 2012-01-20 – 2012-01-21 (×2): 1 via ORAL
  Filled 2012-01-20 (×2): qty 1

## 2012-01-20 MED ORDER — METHOCARBAMOL 500 MG PO TABS: 500.0000 mg | ORAL_TABLET | Freq: Three times a day (TID) | ORAL | Status: DC | PRN

## 2012-01-20 MED ORDER — IBUPROFEN 600 MG PO TABS: 600.0000 mg | ORAL_TABLET | Freq: Three times a day (TID) | ORAL | Status: DC | PRN

## 2012-01-20 MED ORDER — THIAMINE HCL 100 MG/ML IJ SOLN: 100.0000 mg | Freq: Every day | INTRAMUSCULAR | Status: DC

## 2012-01-20 MED ORDER — FOLIC ACID 1 MG PO TABS
1.0000 mg | ORAL_TABLET | Freq: Every day | ORAL | Status: DC
  Administered 2012-01-20 – 2012-01-21 (×2): 1 mg via ORAL
  Filled 2012-01-20 (×2): qty 1

## 2012-01-20 MED ORDER — HYDROXYZINE HCL 25 MG PO TABS: 25.0000 mg | ORAL_TABLET | Freq: Four times a day (QID) | ORAL | Status: DC | PRN

## 2012-01-20 MED ORDER — ONDANSETRON HCL 4 MG PO TABS: 4.0000 mg | ORAL_TABLET | Freq: Three times a day (TID) | ORAL | Status: DC | PRN

## 2012-01-20 MED ORDER — ONDANSETRON 4 MG PO TBDP: 4.0000 mg | ORAL_TABLET | Freq: Four times a day (QID) | ORAL | Status: DC | PRN

## 2012-01-20 MED ORDER — CLONIDINE HCL 0.1 MG PO TABS: 0.1000 mg | ORAL_TABLET | Freq: Every day | ORAL | Status: DC

## 2012-01-20 MED ORDER — LORAZEPAM 1 MG PO TABS: 0.0000 mg | ORAL_TABLET | Freq: Two times a day (BID) | ORAL | Status: DC

## 2012-01-20 MED ORDER — LORAZEPAM 2 MG/ML IJ SOLN: 1.0000 mg | Freq: Four times a day (QID) | INTRAMUSCULAR | Status: DC | PRN

## 2012-01-20 MED ORDER — DICYCLOMINE HCL 20 MG PO TABS: 20.0000 mg | ORAL_TABLET | Freq: Four times a day (QID) | ORAL | Status: DC | PRN

## 2012-01-20 NOTE — ED Notes (Signed)
Pt voluntarily came to ED today for heroin and ETOH detox. Last heroin use on Wednesday and last ETOH was last night, quantility unknown. C/o headache, fatigue, abdominal pain, and "just feel sick". No SI or HI. Denied hallucination.

## 2012-01-20 NOTE — ED Notes (Signed)
Report to Latricia RN  Pt to move to psych holding
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
## 2012-01-20 NOTE — ED Provider Notes (Signed)
History     CSN: [id number]  Arrival date & time 01/20/12  1112   First MD Initiated Contact with Patient 01/20/12 1216      Chief Complaint  Patient presents with  . ETOH and Heroin Detox     (Consider location/radiation/quality/duration/timing/severity/associated sxs/prior treatment) HPI Comments: Patient presents today voluntarily requesting detox from alcohol and heroin.  He reports that he has been using heroin daily.  Last use was last evening.  Last use of alcohol was also last evening.  He reports that he typically drinks a 6 pack of beer two-three times a week.  He denies SI or HI.  He reports that he has been through detox before back in 2011.  No history of DT's.    The history is provided by the patient.    Past Medical History  Diagnosis Date  . Epididymitis   . Depression   . Alcohol abuse     Past Surgical History  Procedure Date  . Nasal septum surgery     No family history on file.  History  Substance Use Topics  . Smoking status: Current Every Day Smoker -- 0.5 packs/day    Types: Cigarettes  . Smokeless tobacco: Not on file  . Alcohol Use: Yes      Review of Systems  Constitutional: Positive for fatigue. Negative for fever and chills.  Gastrointestinal: Positive for nausea.  Neurological: Positive for headaches. Negative for tremors.  Psychiatric/Behavioral: Negative for suicidal ideas, hallucinations, confusion, disturbed wake/sleep cycle and self-injury. The patient is not nervous/anxious.     Allergies  Review of patient's allergies indicates no known allergies.  Home Medications   Current Outpatient Rx  Name Route Sig Dispense Refill  . BUPROPION HCL ER (SR) 150 MG PO TB12 Oral Take 150 mg by mouth 2 (two) times daily.    Marland Kitchen DIPHENHYDRAMINE HCL 25 MG PO TABS Oral Take 25 mg by mouth every 6 (six) hours as needed. For hives.    . IBUPROFEN 200 MG PO TABS Oral Take 800 mg by mouth every 6 (six) hours as needed. For pain.      BP  116/73  Pulse 77  Temp 97.6 F (36.4 C) (Oral)  Resp 18  Wt 185 lb (83.915 kg)  SpO2 97%  Physical Exam  Nursing note and vitals reviewed. Constitutional: He appears well-developed and well-nourished. No distress.  HENT:  Head: Normocephalic and atraumatic.  Eyes: EOM are normal. Pupils are equal, round, and reactive to light.  Neck: Normal range of motion. Neck supple.  Cardiovascular: Normal rate, regular rhythm and normal heart sounds.   Pulmonary/Chest: Effort normal and breath sounds normal.  Musculoskeletal: Normal range of motion.  Neurological: He is alert.  Skin: Skin is warm and dry. He is not diaphoretic.  Psychiatric: He has a normal mood and affect. His speech is normal. He is not agitated, not aggressive and not actively hallucinating. He expresses no homicidal and no suicidal ideation. He expresses no suicidal plans and no homicidal plans.    ED Course  Procedures (including critical care time)  Labs Reviewed  URINALYSIS, ROUTINE W REFLEX MICROSCOPIC - Abnormal; Notable for the following:    APPearance CLOUDY (*)     All other components within normal limits  URINE RAPID DRUG SCREEN (HOSP PERFORMED) - Abnormal; Notable for the following:    Opiates POSITIVE (*)     All other components within normal limits  CBC WITH DIFFERENTIAL  COMPREHENSIVE METABOLIC PANEL  ETHANOL   No  results found.   No diagnosis found.  Patient discussed with Dr. Oletta Lamas.  Consulted ACT team.  They report that they will come evaluate the patient.  MDM  Patient presenting today requesting detox from [facility] and Alcohol.   Psych holding orders have been placed.  [facility] orders and Clonidine orders for opiate detox have also been placed.  Patient denies SI or HI.  ACT team has been notified and has agreed to evaluate the patient.           Pascal Lux [place], PA-C 01/20/12 2240

## 2012-01-20 NOTE — ED Notes (Signed)
Pt belongings in cabinet 2 in triage underneath ice maker. 2 bags. Pt belongings bag and one medium size leather bag

## 2012-01-21 NOTE — BH Assessment (Signed)
Assessment Note   Robert Dougherty is a 21 y.o. male who presents to [facility] requesting detox from alcohol and heroin. He states he has been drinking 6-12 beers daily for 2 weeks and injecting .5-1 gram of heroin daily for the past 2 years. He reports receiving treatment at [facility] in 6/13. He states he is seeking treatment at this time because he is "ready to do anything to do something different." He further expressed that he is "sick of it." He states he has attempted treatment in the past but has been unsuccessful because he was going because someone else wanted him to stop using. He states he is ready for change this time. Current withdrawal symptoms include nausea, stomach pains, headache, hot and cold flashes, irritability, and restlessness. He denies any history of seizures or dts.  He denies SI, HI, and AHVH. He reports a history of depression and reports he is on Wellbutrin. He states he has all medications with him currently. He reports he has a lot of social supports. He is currently homeless. He denies any legal involvement.     Axis I: Alcohol Dependence and Opioid Dependence  Axis II: Deferred Axis III:  Past Medical History  Diagnosis Date  . Epididymitis   . Depression   . Alcohol abuse    Axis IV: housing problems, problems related to social environment and problems with primary support group Axis V: 51-60 moderate symptoms  Past Medical History:  Past Medical History  Diagnosis Date  . Epididymitis   . Depression   . Alcohol abuse     Past Surgical History  Procedure Date  . Nasal septum surgery     Family History: No family history on file.  Social History:  reports that he has been smoking Cigarettes.  He has been smoking about .5 packs per day. He does not have any smokeless tobacco history on file. He reports that he drinks alcohol. He reports that he uses illicit drugs (IV).  Additional Social History:  Alcohol / Drug Use History of alcohol / drug use?:  Yes Substance #1 Name of Substance 1: alcohol 1 - Age of First Use: teens 1 - Amount (size/oz): 6-12 pack of beer 1 - Frequency: dailt 1 - Duration: 2 weeks 1 - Last Use / Amount: 10/2 Substance #2 Name of Substance 2: heroin 2 - Age of First Use: 11 2 - Amount (size/oz): 05-1 gram 2 - Frequency: daily 2 - Duration: 3 months, heavily for 2 years 2 - Last Use / Amount: 10/2  CIWA: CIWA-Ar BP: 99/57 mmHg Pulse Rate: 61  Nausea and Vomiting: no nausea and no vomiting Tactile Disturbances: none Tremor: no tremor Auditory Disturbances: not present Paroxysmal Sweats: barely perceptible sweating, palms moist Visual Disturbances: not present Anxiety: mildly anxious Headache, Fullness in Head: mild Agitation: two Orientation and Clouding of Sensorium: oriented and can do serial additions CIWA-Ar Total: 6  COWS: Clinical Opiate Withdrawal Scale (COWS) Resting Pulse Rate: Pulse Rate 80 or below Sweating: Subjective report of chills or flushing Restlessness: Able to sit still Pupil Size: Pupils pinned or normal size for room light Bone or Joint Aches: Not present Runny Nose or Tearing: Not present GI Upset: No GI symptoms Tremor: No tremor Yawning: No yawning Anxiety or Irritability: Patient reports increasing irritability or anxiousness Gooseflesh Skin: Skin is smooth COWS Total Score: 2   Allergies: No Known Allergies  Home Medications:  (Not in a hospital admission)  OB/GYN Status:  No LMP for male patient.  General  Assessment Data Location of Assessment: [facility] Living Arrangements: Other (Comment) (homeless) Can pt return to current living arrangement?: Yes Admission Status: Voluntary Is patient capable of signing voluntary admission?: Yes Transfer from: Acute Hospital Referral Source: Self/Family/Friend  Education Status Is patient currently in school?: No  Risk to self Suicidal Ideation: No Suicidal Intent: No Is patient at risk for suicide?: No Suicidal  Plan?: No Access to Means: No What has been your use of drugs/alcohol within the last 12 months?: alcohol and heroin Previous Attempts/Gestures: No How many times?: 2  Other Self Harm Risks: none Triggers for Past Attempts: None known Intentional Self Injurious Behavior: None Family Suicide History: No Recent stressful life event(s): Turmoil (Comment) Persecutory voices/beliefs?: No Depression: Yes Depression Symptoms: Feeling worthless/self pity;Loss of interest in usual pleasures Substance abuse history and/or treatment for substance abuse?: Yes Suicide prevention information given to non-admitted patients: Not applicable  Risk to Others Homicidal Ideation: No Thoughts of Harm to Others: No Current Homicidal Intent: No Current Homicidal Plan: No Access to Homicidal Means: No Identified Victim: none History of harm to others?: No Assessment of Violence: None Noted Violent Behavior Description: coopeative Does patient have access to weapons?: No Criminal Charges Pending?: No Does patient have a court date: No  Psychosis Hallucinations: None noted Delusions: None noted  Mental Status Report Appear/Hygiene: Disheveled Eye Contact: Good Motor Activity: Unremarkable Speech: Logical/coherent Level of Consciousness: Quiet/awake Mood: Depressed Affect: Appropriate to circumstance Anxiety Level: Minimal Thought Processes: Coherent;Relevant Judgement: Unimpaired Orientation: Person;Place;Time;Situation Obsessive Compulsive Thoughts/Behaviors: None  Cognitive Functioning Concentration: Normal Memory: Recent Intact;Remote Intact IQ: Average Insight: Fair Impulse Control: Poor Appetite: Fair Weight Loss: 0  Weight Gain: 0  Sleep: Decreased Vegetative Symptoms: None  ADLScreening [facility]) Patient's cognitive ability adequate to safely complete daily activities?: Yes Patient able to express need for assistance with ADLs?: Yes Independently performs  ADLs?: Yes (appropriate for developmental age)  Abuse/Neglect [facility]) Physical Abuse: Denies Verbal Abuse: Denies Sexual Abuse: Denies  Prior Inpatient Therapy Prior Inpatient Therapy: Yes Prior Therapy Dates: 2011 Prior Therapy Facilty/Provider(s): [facility] Reason for Treatment: detox  Prior Outpatient Therapy Prior Outpatient Therapy: Yes Prior Therapy Dates: 2013 Prior [facility] Fac[facility] Reason for Treatment: SA  ADL Screening (condition at time of admission) Patient's cognitive ability adequate to safely complete daily activities?: Yes Patient able to express need for assistance with ADLs?: Yes Independently performs ADLs?: Yes (appropriate for developmental age) Weakness of Legs: None Weakness of Arms/Hands: None  Home Assistive Devices/Equipment Home Assistive Devices/Equipment: None    Abuse/Neglect Assessment (Assessment to be complete while patient is alone) Physical Abuse: Denies Verbal Abuse: Denies Sexual Abuse: Denies Exploitation of patient/patient's resources: Denies Self-Neglect: Denies     Merchant navy officer (For Healthcare) Advance Directive: Patient does not have advance directive;Patient would not like information Pre-existing out of facility DNR order (yellow form or pink MOST form): No Nutrition Screen- [facility] Patient's home diet: Regular Have you recently lost weight without trying?: No Have you been eating poorly because of a decreased appetite?: No Malnutrition Screening Tool Score: 0   Additional Information 1:1 In Past 12 Months?: No CIRT Risk: No Elopement Risk: No Does patient have medical clearance?: Yes     Disposition:  Disposition Disposition of Patient: Referred to;Inpatient treatment program Type of inpatient treatment program: Adult Patient referred to: [facility]  On Site Evaluation by:   Reviewed with Physician:     Georgina Quint A 01/21/2012 2:11 AM

## 2012-01-21 NOTE — ED Notes (Signed)
Pt's ride is picking him up out front so writer will walk him out soon.

## 2012-01-21 NOTE — ED Notes (Signed)
Pt dad called in and asked if pt had been seen yet or not explained to him we can't give that info over the phone and the pt is sleeping. He asked that writer give pt the message that "I love him & I'm proud of him for coming in there" Dad's number is [phone]

## 2012-01-21 NOTE — ED Provider Notes (Signed)
The patient is alert, cooperative. He was able to sleep fairly well. Last night. His last heroin use, was greater than 3 days ago. He, states he wants treatment to understand alcoholism, and cocaine abuse. [facility]  will get him outpatient referrals  Flint Melter, MD 01/21/12 1013

## 2012-01-21 NOTE — ED Notes (Signed)
Pt asked if writer if she knew why [facility] wouldn't accept him for their 14 day program but Clinical research associate didn't have any information about that at all. Pt has called for a ride a couple times but hasn't gotten a ride yet.

## 2012-01-21 NOTE — ED Notes (Signed)
Pt up using the phone talking to [facility]

## 2012-01-21 NOTE — ED Notes (Signed)
Pt in his room sitting up in his bed with visitor in his room.

## 2012-01-21 NOTE — ED Notes (Signed)
Pt was walked out by a tech and belongings were returned to him.

## 2012-01-24 NOTE — ED Provider Notes (Signed)
Medical screening examination/treatment/procedure(s) were performed by non-physician practitioner and as supervising physician I was immediately available for consultation/collaboration.   Ab Leaming Y. Dorien Mayotte, MD 01/24/12 2356 

## 2012-02-14 ENCOUNTER — Encounter (HOSPITAL_BASED_OUTPATIENT_CLINIC_OR_DEPARTMENT_OTHER): Payer: Self-pay | Admitting: *Deleted

## 2012-02-14 ENCOUNTER — Emergency Department (HOSPITAL_BASED_OUTPATIENT_CLINIC_OR_DEPARTMENT_OTHER)
Admission: EM | Admit: 2012-02-14 | Discharge: 2012-02-14 | Disposition: A | Discharge status: Home / Self Care | Payer: Self-pay | Attending: Emergency Medicine | Admitting: Emergency Medicine

## 2012-02-14 DIAGNOSIS — N453 Epididymo-orchitis: Secondary | ICD-10-CM | POA: Insufficient documentation

## 2012-02-14 DIAGNOSIS — F3289 Other specified depressive episodes: Secondary | ICD-10-CM | POA: Insufficient documentation

## 2012-02-14 DIAGNOSIS — F1010 Alcohol abuse, uncomplicated: Secondary | ICD-10-CM | POA: Insufficient documentation

## 2012-02-14 DIAGNOSIS — F329 Major depressive disorder, single episode, unspecified: Secondary | ICD-10-CM | POA: Insufficient documentation

## 2012-02-14 DIAGNOSIS — F17200 Nicotine dependence, unspecified, uncomplicated: Secondary | ICD-10-CM | POA: Insufficient documentation

## 2012-02-14 DIAGNOSIS — L290 Pruritus ani: Secondary | ICD-10-CM | POA: Insufficient documentation

## 2012-02-14 DIAGNOSIS — B86 Scabies: Secondary | ICD-10-CM | POA: Insufficient documentation

## 2012-02-14 MED ORDER — PERMETHRIN 5 % EX CREA: TOPICAL_CREAM | CUTANEOUS | Status: DC

## 2012-02-14 NOTE — ED Notes (Signed)
Patient states he is a resident at [facility].  States for the last one week, he has developed a rash on his right inner thigh.  Describes as increasing in size and very itchy.  Several bumps on his left wrist which he noticed today.

## 2012-02-14 NOTE — ED Provider Notes (Signed)
History     CSN: [id number]  Arrival date & time 02/14/12  6962   First MD Initiated Contact with Patient 02/14/12 [phone]      Chief Complaint  Patient presents with  . Rash    upper right inner thigh    (Consider location/radiation/quality/duration/timing/severity/associated sxs/prior treatment) Patient is a 21 y.o. male presenting with rash. The history is provided by the patient.  Rash    patient with itchy rash to the inner thigh groin area some to the left wrist. Been present for 1 week new areas just started recently it is very itchy. Patient has a inpatient day [facility] prior to that he was at our [facility]. Denies fever nausea vomiting diarrhea abdominal pain shortness of breath or history of similar symptoms.  Past Medical History  Diagnosis Date  . Epididymitis   . Depression   . Alcohol abuse     Past Surgical History  Procedure Date  . Nasal septum surgery     No family history on file.  History  Substance Use Topics  . Smoking status: Current Every Day Smoker -- 0.5 packs/day    Types: Cigarettes  . Smokeless tobacco: Not on file  . Alcohol Use: No     Clean since 01/20/12      Review of Systems  Constitutional: Negative for fever.  HENT: Negative for congestion and neck pain.   Eyes: Negative for redness.  Respiratory: Negative for shortness of breath.   Cardiovascular: Negative for chest pain.  Gastrointestinal: Negative for nausea, vomiting and abdominal pain.  Genitourinary: Negative for dysuria.  Musculoskeletal: Negative for back pain.  Skin: Positive for rash.  Neurological: Negative for headaches.  Hematological: Does not bruise/bleed easily.    Allergies  Review of patient's allergies indicates no known allergies.  Home Medications   Current Outpatient Rx  Name Route Sig Dispense Refill  . BUPROPION HCL ER (SR) 150 MG PO TB12 Oral Take 150 mg by mouth 2 (two) times daily.    Marland Kitchen DIPHENHYDRAMINE HCL 25 MG PO TABS Oral Take 25 mg by mouth  every 6 (six) hours as needed. For hives.    . IBUPROFEN 200 MG PO TABS Oral Take 800 mg by mouth every 6 (six) hours as needed. For pain.    Marland Kitchen PERMETHRIN 5 % EX CREA  Apply to affected area once, wash off after 8 hours, then repeat in one week. 10 g 1    BP 117/72  Pulse 81  Temp 97.9 F (36.6 C) (Oral)  Resp 18  Ht 6\' 2"  (1.88 m)  Wt 195 lb (88.451 kg)  BMI 25.04 kg/m2  SpO2 99%  Physical Exam  Nursing note and vitals reviewed. Constitutional: He is oriented to person, place, and time. He appears well-developed and well-nourished. No distress.  HENT:  Head: Normocephalic and atraumatic.  Mouth/Throat: Oropharynx is clear and moist.  Cardiovascular: Normal rate, regular rhythm and normal heart sounds.   No murmur heard. Pulmonary/Chest: Effort normal and breath sounds normal. No respiratory distress.  Abdominal: Soft. There is no tenderness.  Musculoskeletal: Normal range of motion.  Lymphadenopathy:    He has no cervical adenopathy.  Neurological: He is alert and oriented to person, place, and time. No cranial nerve deficit. He exhibits normal muscle tone. Coordination normal.  Skin: Rash noted.       Erythematous papules with some tracking most probably dominant on the right inner fine groin area some on the left thigh groin area and some on the left  wrist.    ED Course  Procedures (including critical care time)  Labs Reviewed - No data to display No results found.   1. Scabies       MDM  Symptoms his skin findings most consistent with scabies. Will treat with Elimite. Patient is at a market will be notified.         Shelda Jakes, MD 02/14/12 [phone]

## 2012-03-08 ENCOUNTER — Encounter (HOSPITAL_BASED_OUTPATIENT_CLINIC_OR_DEPARTMENT_OTHER): Payer: Self-pay | Admitting: *Deleted

## 2012-03-08 ENCOUNTER — Emergency Department (HOSPITAL_BASED_OUTPATIENT_CLINIC_OR_DEPARTMENT_OTHER)
Admission: EM | Admit: 2012-03-08 | Discharge: 2012-03-08 | Disposition: A | Discharge status: Home / Self Care | Payer: Self-pay | Attending: Emergency Medicine | Admitting: Emergency Medicine

## 2012-03-08 ENCOUNTER — Emergency Department (HOSPITAL_BASED_OUTPATIENT_CLINIC_OR_DEPARTMENT_OTHER): Payer: Self-pay

## 2012-03-08 DIAGNOSIS — N509 Disorder of male genital organs, unspecified: Secondary | ICD-10-CM | POA: Insufficient documentation

## 2012-03-08 DIAGNOSIS — F1010 Alcohol abuse, uncomplicated: Secondary | ICD-10-CM | POA: Insufficient documentation

## 2012-03-08 DIAGNOSIS — F329 Major depressive disorder, single episode, unspecified: Secondary | ICD-10-CM | POA: Insufficient documentation

## 2012-03-08 DIAGNOSIS — N50819 Testicular pain, unspecified: Secondary | ICD-10-CM

## 2012-03-08 DIAGNOSIS — N453 Epididymo-orchitis: Secondary | ICD-10-CM | POA: Insufficient documentation

## 2012-03-08 DIAGNOSIS — F17200 Nicotine dependence, unspecified, uncomplicated: Secondary | ICD-10-CM | POA: Insufficient documentation

## 2012-03-08 DIAGNOSIS — F3289 Other specified depressive episodes: Secondary | ICD-10-CM | POA: Insufficient documentation

## 2012-03-08 DIAGNOSIS — Z79899 Other long term (current) drug therapy: Secondary | ICD-10-CM | POA: Insufficient documentation

## 2012-03-08 LAB — URINALYSIS, ROUTINE W REFLEX MICROSCOPIC
Bilirubin Urine: NEGATIVE
Glucose, UA: NEGATIVE mg/dL
Ketones, ur: NEGATIVE mg/dL
Leukocytes, UA: NEGATIVE
Nitrite: NEGATIVE
Specific Gravity, Urine: 1.013 (ref 1.005–1.030)
pH: 7 (ref 5.0–8.0)

## 2012-03-08 MED ORDER — HYDROCODONE-ACETAMINOPHEN 5-325 MG PO TABS: 2.0000 | ORAL_TABLET | ORAL | Status: DC | PRN

## 2012-03-08 MED ORDER — AZITHROMYCIN 250 MG PO TABS
1000.0000 mg | ORAL_TABLET | Freq: Once | ORAL | Status: AC
  Administered 2012-03-08: 1000 mg via ORAL
  Filled 2012-03-08: qty 4

## 2012-03-08 MED ORDER — CEFTRIAXONE SODIUM 250 MG IJ SOLR
250.0000 mg | Freq: Once | INTRAMUSCULAR | Status: AC
  Administered 2012-03-08: 250 mg via INTRAMUSCULAR
  Filled 2012-03-08: qty 250

## 2012-03-08 MED ORDER — OXYCODONE-ACETAMINOPHEN 5-325 MG PO TABS
2.0000 | ORAL_TABLET | Freq: Once | ORAL | Status: AC
  Administered 2012-03-08: 2 via ORAL
  Filled 2012-03-08 (×2): qty 2

## 2012-03-08 MED ORDER — HYDROMORPHONE HCL PF 1 MG/ML IJ SOLN
1.0000 mg | Freq: Once | INTRAMUSCULAR | Status: AC
  Administered 2012-03-08: 1 mg via INTRAMUSCULAR
  Filled 2012-03-08: qty 1

## 2012-03-08 NOTE — ED Notes (Signed)
Pt refused RX for pain medication. Advised MD. Pt made aware to take tylenol or ibuprofen for pain.

## 2012-03-08 NOTE — ED Provider Notes (Signed)
History     CSN: [id number]  Arrival date & time 03/08/12  1315   First MD Initiated Contact with Patient 03/08/12 1338      Chief Complaint  Patient presents with  . Testicle Pain    (Consider location/radiation/quality/duration/timing/severity/associated sxs/prior treatment) HPI Comments: Patient presents right testicle pain for the past one day. He said epididymitis twice before and this feels similar. Denies any trauma, abdominal pain, nausea vomiting or fever. Denies any back pain, dysuria or hematuria. Denies any discharge from his penis.  The history is provided by the patient.    Past Medical History  Diagnosis Date  . Epididymitis   . Depression   . Alcohol abuse     Past Surgical History  Procedure Date  . Nasal septum surgery     No family history on file.  History  Substance Use Topics  . Smoking status: Current Every Day Smoker -- 0.5 packs/day    Types: Cigarettes  . Smokeless tobacco: Not on file  . Alcohol Use: No     Comment: Clean since 01/20/12      Review of Systems  Constitutional: Negative for fever, activity change and appetite change.  HENT: Negative for congestion and rhinorrhea.   Respiratory: Negative for cough and shortness of breath.   Cardiovascular: Negative for chest pain.  Gastrointestinal: Negative for nausea, vomiting and abdominal pain.  Genitourinary: Positive for testicular pain. Negative for dysuria and penile pain.  Musculoskeletal: Negative for back pain.  Skin: Negative for rash.  Neurological: Negative for dizziness and headaches.    Allergies  Review of patient's allergies indicates no known allergies.  Home Medications   Current Outpatient Rx  Name  Route  Sig  Dispense  Refill  . BUPROPION HCL ER (SR) 150 MG PO TB12   Oral   Take 150 mg by mouth 2 (two) times daily.         Marland Kitchen DIPHENHYDRAMINE HCL 25 MG PO TABS   Oral   Take 25 mg by mouth every 6 (six) hours as needed. For hives.         Marland Kitchen  HYDROCODONE-ACETAMINOPHEN 5-325 MG PO TABS   Oral   Take 2 tablets by mouth every 4 (four) hours as needed for pain.   6 tablet   0   . IBUPROFEN 200 MG PO TABS   Oral   Take 800 mg by mouth every 6 (six) hours as needed. For pain.         Marland Kitchen PERMETHRIN 5 % EX CREA      Apply to affected area once, wash off after 8 hours, then repeat in one week.   10 g   1     BP 127/58  Pulse 91  Temp 98 F (36.7 C) (Oral)  Resp 20  SpO2 99%  Physical Exam  Constitutional: He is oriented to person, place, and time. He appears well-developed and well-nourished. No distress.  HENT:  Head: Normocephalic and atraumatic.  Mouth/Throat: Oropharynx is clear and moist. No oropharyngeal exudate.  Eyes: Conjunctivae normal and EOM are normal. Pupils are equal, round, and reactive to light.  Neck: Normal range of motion. Neck supple.  Cardiovascular: Normal rate, regular rhythm and normal heart sounds.   No murmur heard. Pulmonary/Chest: Effort normal and breath sounds normal. No respiratory distress.  Abdominal: Soft. There is no tenderness. There is no rebound and no guarding.  Genitourinary:       Right testicle larger than leftt with epididymal tenderness, no  overlying cellulitis, no penile discharge  Musculoskeletal: Normal range of motion. He exhibits no edema and no tenderness.  Neurological: He is alert and oriented to person, place, and time. No cranial nerve deficit. He exhibits normal muscle tone. Coordination normal.  Skin: Skin is warm.    ED Course  Procedures (including critical care time)   Labs Reviewed  URINALYSIS, ROUTINE W REFLEX MICROSCOPIC  GC/CHLAMYDIA PROBE AMP   US Scrotum  03/08/2012  *RADIOLOGY REPORT*  Clinical Data:  21 year old male with severe right testicular pain times 2 days.  SCROTAL ULTRASOUND DOPPLER ULTRASOUND OF THE TESTICLES  Technique: Complete ultrasound examination of the testicles, epididymis, and other scrotal structures was performed.  Color  and spectral Doppler ultrasound were also utilized to evaluate blood flow to the testicles.  Comparison:  11/22/2011 and earlier.  Findings:  Right testis:  5.6 x 3.7 x 4 cm.  Echotexture appears stable within normal limits.  No discrete testicular mass.  Left testis:  5 x 3.1 x 3.9 cm.  Echotexture appears stable and within normal limits.  No testicular mass.  Right epididymis:  Normal in size and appearance.  Left epididymis:  Normal in size and appearance.  Hydrocele:  Trace on the left, decreased.  Varicocele:  Absent.  Pulsed Doppler interrogation of both testes demonstrates low resistance flow bilaterally.Symmetric color Doppler flow noted.  No areas of hypervascularity.  IMPRESSION: Negative scrotal ultrasound with Doppler.  No evidence of testicular torsion.   Original Report Authenticated By: Erskine Speed, M.D.    [place] Art/ven Flow Abd Pelv Doppler  03/08/2012  *RADIOLOGY REPORT*  Clinical Data:  21 year old male with severe right testicular pain times 2 days.  SCROTAL ULTRASOUND DOPPLER ULTRASOUND OF THE TESTICLES  Technique: Complete ultrasound examination of the testicles, epididymis, and other scrotal structures was performed.  Color and spectral Doppler ultrasound were also utilized to evaluate blood flow to the testicles.  Comparison:  11/22/2011 and earlier.  Findings:  Right testis:  5.6 x 3.7 x 4 cm.  Echotexture appears stable within normal limits.  No discrete testicular mass.  Left testis:  5 x 3.1 x 3.9 cm.  Echotexture appears stable and within normal limits.  No testicular mass.  Right epididymis:  Normal in size and appearance.  Left epididymis:  Normal in size and appearance.  Hydrocele:  Trace on the left, decreased.  Varicocele:  Absent.  Pulsed Doppler interrogation of both testes demonstrates low resistance flow bilaterally.Symmetric color Doppler flow noted.  No areas of hypervascularity.  IMPRESSION: Negative scrotal ultrasound with Doppler.  No evidence of testicular torsion.    Original Report Authenticated By: Erskine Speed, M.D.      1. Testicle pain       MDM  Testicular pain with history of epididymitis. Vital stable, no distress, abdomen soft and nontender UA negative. GC swab sent.  No evidence of torsion on [place]. Empirically treated for orchitis/epididymitis with rocephin and azithromycin. Given recurrent testicular pain, needs urology followup. 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 Recommend ice, NSAID, scrotal support.     Glynn Octave, MD 03/08/12 1754

## 2012-03-08 NOTE — ED Notes (Signed)
Right testicle is swollen. States this is the third time it has happened. Every couple of months he is treated for epidermitis.

## 2012-04-14 ENCOUNTER — Emergency Department (HOSPITAL_BASED_OUTPATIENT_CLINIC_OR_DEPARTMENT_OTHER)
Admission: EM | Admit: 2012-04-14 | Discharge: 2012-04-14 | Discharge status: Against Medical Advice | Payer: Self-pay | Attending: Emergency Medicine | Admitting: Emergency Medicine

## 2012-04-14 ENCOUNTER — Encounter (HOSPITAL_BASED_OUTPATIENT_CLINIC_OR_DEPARTMENT_OTHER): Payer: Self-pay | Admitting: *Deleted

## 2012-04-14 DIAGNOSIS — F329 Major depressive disorder, single episode, unspecified: Secondary | ICD-10-CM | POA: Insufficient documentation

## 2012-04-14 DIAGNOSIS — N5089 Other specified disorders of the male genital organs: Secondary | ICD-10-CM | POA: Insufficient documentation

## 2012-04-14 DIAGNOSIS — N509 Disorder of male genital organs, unspecified: Secondary | ICD-10-CM | POA: Insufficient documentation

## 2012-04-14 DIAGNOSIS — F3289 Other specified depressive episodes: Secondary | ICD-10-CM | POA: Insufficient documentation

## 2012-04-14 DIAGNOSIS — N50811 Right testicular pain: Secondary | ICD-10-CM

## 2012-04-14 DIAGNOSIS — F17200 Nicotine dependence, unspecified, uncomplicated: Secondary | ICD-10-CM | POA: Insufficient documentation

## 2012-04-14 DIAGNOSIS — Z79899 Other long term (current) drug therapy: Secondary | ICD-10-CM | POA: Insufficient documentation

## 2012-04-14 DIAGNOSIS — Z87448 Personal history of other diseases of urinary system: Secondary | ICD-10-CM | POA: Insufficient documentation

## 2012-04-14 DIAGNOSIS — F1010 Alcohol abuse, uncomplicated: Secondary | ICD-10-CM | POA: Insufficient documentation

## 2012-04-14 LAB — URINALYSIS, ROUTINE W REFLEX MICROSCOPIC
Hgb urine dipstick: NEGATIVE
Nitrite: NEGATIVE
Specific Gravity, Urine: 1.012 (ref 1.005–1.030)
Urobilinogen, UA: 0.2 mg/dL (ref 0.0–1.0)
pH: 6.5 (ref 5.0–8.0)

## 2012-04-14 MED ORDER — LIDOCAINE HCL (PF) 1 % IJ SOLN
INTRAMUSCULAR | Status: AC
  Filled 2012-04-14: qty 5

## 2012-04-14 MED ORDER — AZITHROMYCIN 1 G PO PACK
1.0000 g | PACK | Freq: Once | ORAL | Status: AC
  Administered 2012-04-14: 1 g via ORAL
  Filled 2012-04-14: qty 1

## 2012-04-14 MED ORDER — CEFTRIAXONE SODIUM 250 MG IJ SOLR
250.0000 mg | Freq: Once | INTRAMUSCULAR | Status: AC
  Administered 2012-04-14: 250 mg via INTRAMUSCULAR
  Filled 2012-04-14: qty 250

## 2012-04-14 MED ORDER — KETOROLAC TROMETHAMINE 60 MG/2ML IM SOLN
60.0000 mg | Freq: Once | INTRAMUSCULAR | Status: AC
  Administered 2012-04-14: 60 mg via INTRAMUSCULAR
  Filled 2012-04-14: qty 2

## 2012-04-14 NOTE — ED Notes (Signed)
Pt refused ABT/US Tx wanting to wait until he saw his own MD.Pt left AMA ,MD aware,charged nurse made aware.

## 2012-04-14 NOTE — ED Provider Notes (Addendum)
History     CSN: [id number]  Arrival date & time 04/14/12  0137   First MD Initiated Contact with Patient 04/14/12 0253      Chief Complaint  Patient presents with  . Testicle Pain    (Consider location/radiation/quality/duration/timing/severity/associated sxs/prior treatment) Patient is a 21 y.o. male presenting with testicular pain. The history is provided by the patient.  Testicle Pain This is a recurrent problem. The current episode started yesterday. The problem occurs constantly. The problem has not changed since onset.Pertinent negatives include no abdominal pain. Nothing aggravates the symptoms. He has tried nothing for the symptoms. The treatment provided no relief.  Swelling also no f/c/r. Not followed up with urology as directed. No urinary symptoms no trauma  Past Medical History  Diagnosis Date  . Epididymitis   . Depression   . Alcohol abuse     Past Surgical History  Procedure Date  . Nasal septum surgery     No family history on file.  History  Substance Use Topics  . Smoking status: Current Every Day Smoker -- 0.5 packs/day    Types: Cigarettes  . Smokeless tobacco: Not on file  . Alcohol Use: No     Comment: Clean since 01/20/12      Review of Systems  Gastrointestinal: Negative for abdominal pain.  Genitourinary: Positive for scrotal swelling and testicular pain. Negative for dysuria, urgency, frequency, flank pain, discharge, penile swelling, difficulty urinating, genital sores and penile pain.  All other systems reviewed and are negative.    Allergies  Review of patient's allergies indicates no known allergies.  Home Medications   Current Outpatient Rx  Name  Route  Sig  Dispense  Refill  . BUPROPION HCL ER (SR) 150 MG PO TB12   Oral   Take 150 mg by mouth 2 (two) times daily.         Marland Kitchen DIPHENHYDRAMINE HCL 25 MG PO TABS   Oral   Take 25 mg by mouth every 6 (six) hours as needed. For hives.         Marland Kitchen HYDROCODONE-ACETAMINOPHEN  5-325 MG PO TABS   Oral   Take 2 tablets by mouth every 4 (four) hours as needed for pain.   6 tablet   0   . IBUPROFEN 200 MG PO TABS   Oral   Take 800 mg by mouth every 6 (six) hours as needed. For pain.         Marland Kitchen PERMETHRIN 5 % EX CREA      Apply to affected area once, wash off after 8 hours, then repeat in one week.   10 g   1     BP 127/75  Pulse 76  Temp 97.8 F (36.6 C) (Oral)  Resp 18  Ht 6\' 2"  (1.88 m)  Wt 195 lb (88.451 kg)  BMI 25.04 kg/m2  SpO2 99%  Physical Exam  Constitutional: He is oriented to person, place, and time. He appears well-developed and well-nourished. No distress.  HENT:  Head: Normocephalic and atraumatic.  Mouth/Throat: Oropharynx is clear and moist.  Eyes: Conjunctivae normal are normal. Pupils are equal, round, and reactive to light.  Neck: Normal range of motion. Neck supple.  Cardiovascular: Normal rate and regular rhythm.   Pulmonary/Chest: Effort normal and breath sounds normal.  Abdominal: Soft. Bowel sounds are normal. There is no tenderness. There is no rebound and no guarding.  Genitourinary:       Testicles vertically aligned.  Negative prehn sign.  No masses  palpable Counselling psychologist present as chaperone  Musculoskeletal: Normal range of motion.  Neurological: He is alert and oriented to person, place, and time.  Skin: Skin is warm and dry.  Psychiatric: He has a normal mood and affect.    ED Course  Procedures (including critical care time)   Labs Reviewed  URINALYSIS, ROUTINE W REFLEX MICROSCOPIC  URINE CULTURE   No results found.   No diagnosis found.    MDM  Refusing antibiotics stating he never gets them, according all previous noted he has had rocephin and azithromycin.  Also refusing [place] to exclude torsion.  EDP had a lengthy discussion with the patient about need for the antibiotics and that he has had these in the past and he has had ultrasounds to exclude torsion.  Patient has decision making capacity to refuse  treatment and transfer for testing and ongoing care.  He is advised by the EDP the risks of leaving against medical advice are but are not limited to: torsion resulting in necrosis of the testicle, sepsis from same, infertility and continued morbidity patient verbalizes that he understands these risks.  He is welcomed to return at any time       Saniyya Gau K Leyton Magoon-Rasch, MD 04/14/12 0516  Daymien Goth K Shariq Puig-Rasch, MD 04/14/12 [phone]

## 2012-04-14 NOTE — ED Notes (Signed)
Pt c/o swollen testicle since yesterday. Pt reports straining to urinate.

## 2012-04-15 LAB — URINE CULTURE

## 2012-12-10 IMAGING — US US SCROTUM
1 series · 14 of 25 positions shown · non-contrast
Comparison: None.

CLINICAL DATA: Evaluate for testicular torsion

SCROTAL ULTRASOUND
DOPPLER ULTRASOUND OF THE TESTICLES
TECHNIQUE: Complete ultrasound examination of the testicles,
epididymis, and other scrotal structures was performed.  Color and
spectral Doppler ultrasound were also utilized to evaluate blood
flow to the testicles.

[Series 1: us scrotum · 0.09mm/px · 14 of 32 slices shown]
[im 1/32]
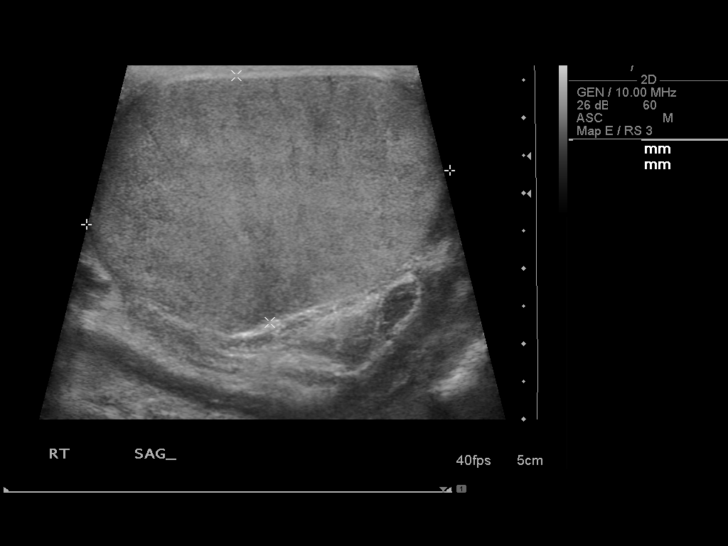
[im 3/32]
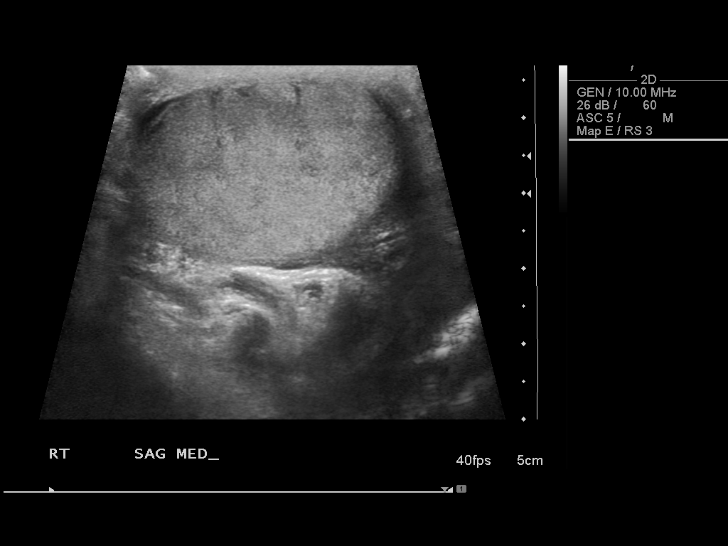
[im 6/32]
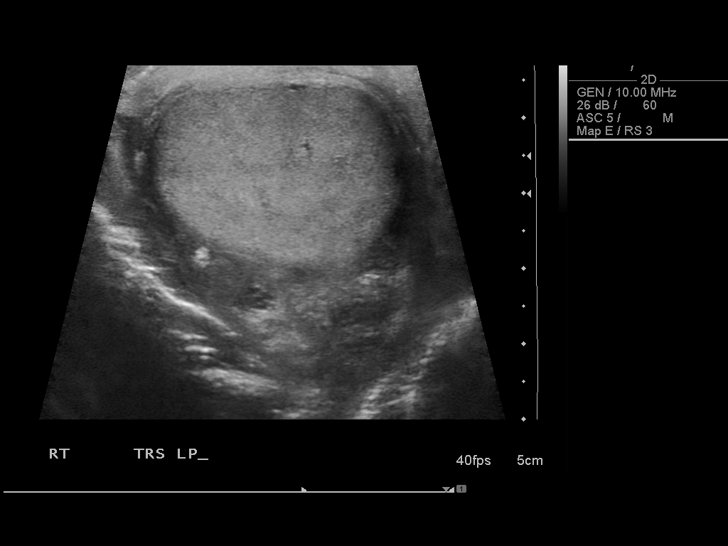
[im 8/32]
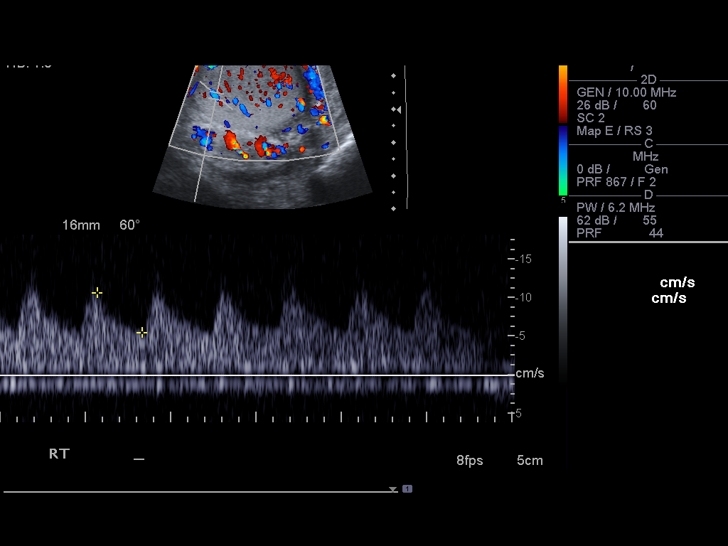
[im 11/32]
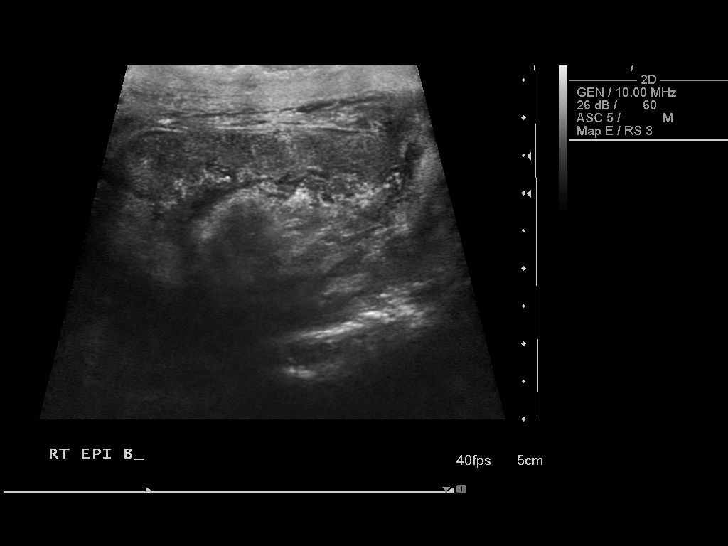
[im 12/32]
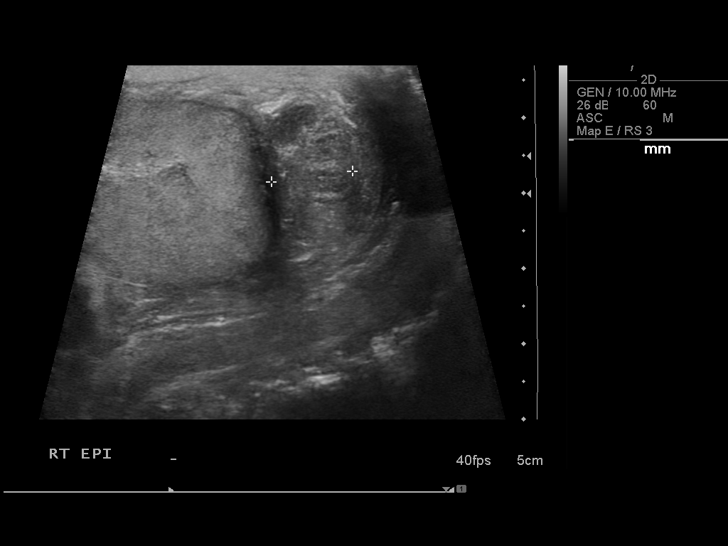
[im 15/32]
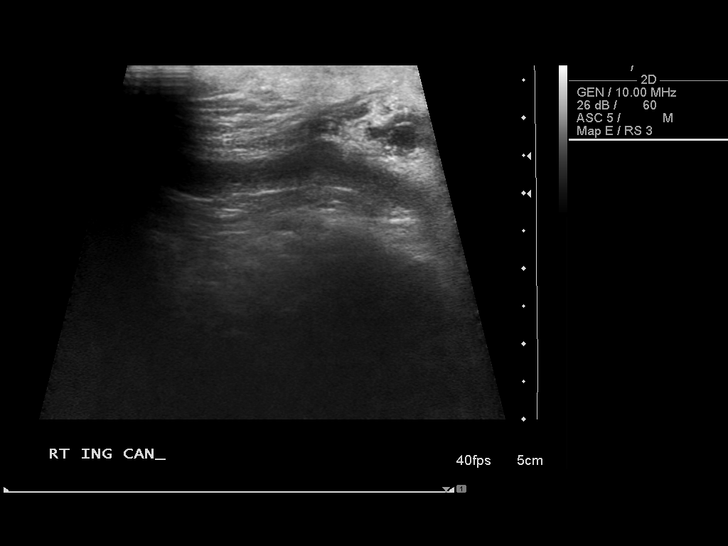
[im 17/32]
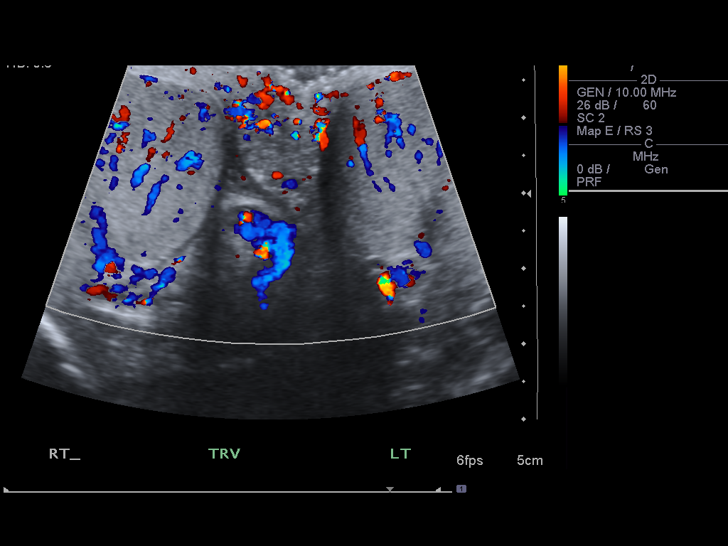
[im 20/32]
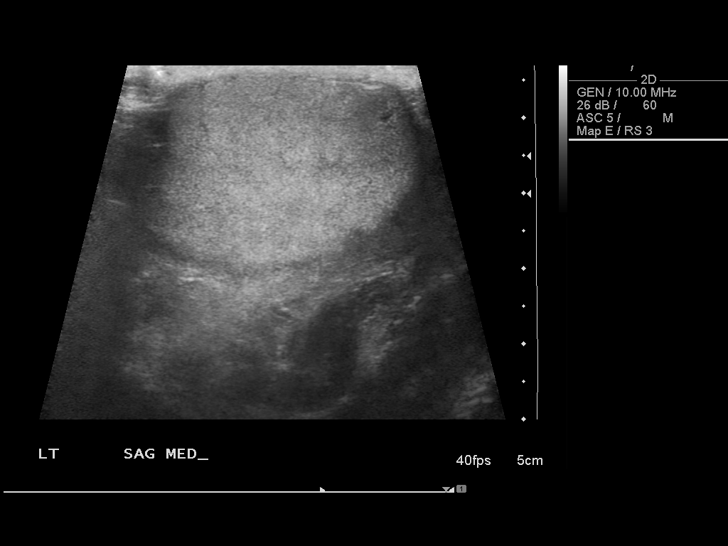
[im 21/32]
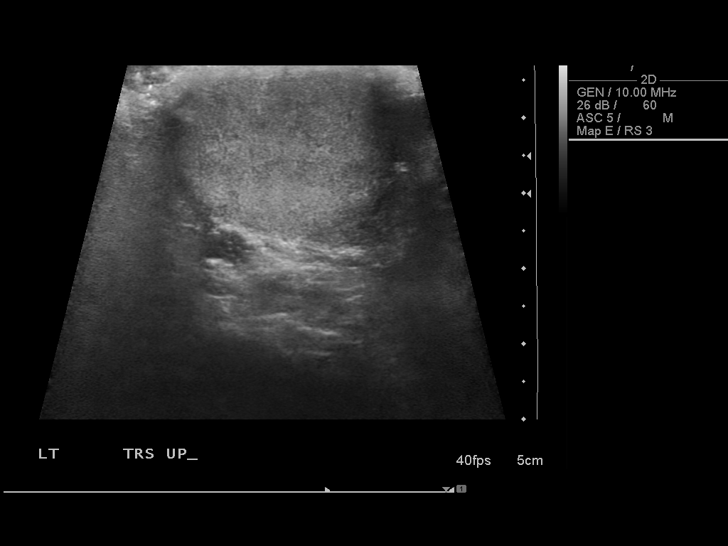
[im 24/32]
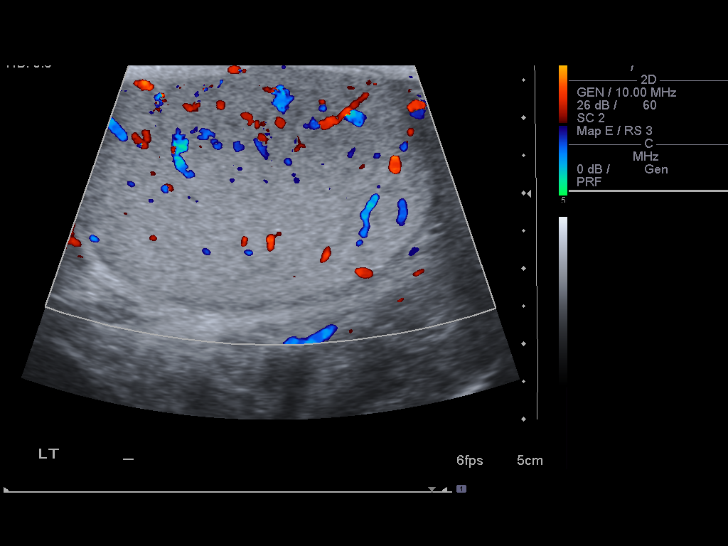
[im 26/32]
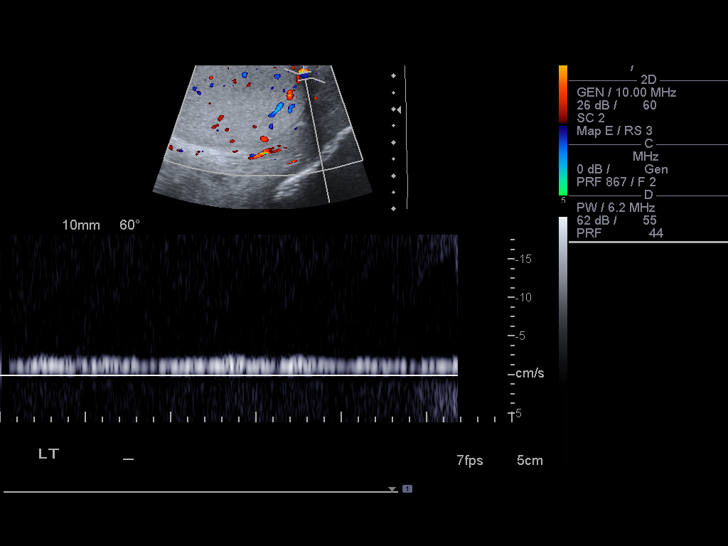
[im 29/32]
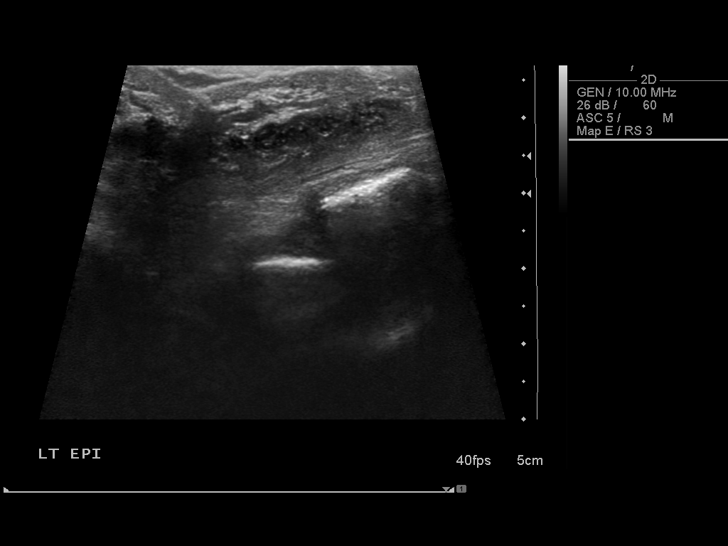
[im 32/32]
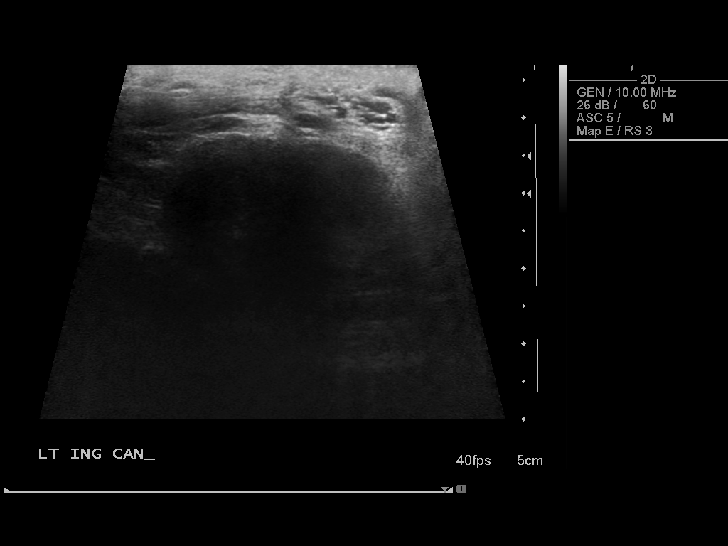

[14 of 25 positions shown; findings below may reference images not displayed]

FINDINGS: The testicles are symmetric in size and echogenicity.
No testicular masses are seen, and there is no evidence of
microlithiasis.  Asymmetric enlargement of the right epididymis.
There is no evidence of hydrocele, varicocele, or other extra-
testicular abnormality.

Blood flow is seen within both testicles on color Doppler
sonography.Increased blood flow to the right testis is noted.
Doppler spectral waveforms show both arterial and venous flow
signal in both testicles.
IMPRESSION: 1.  Negative for testicular mass or torsion.
2.  Increased blood flow to the right testis with enlargement of
the right epididymis.  Findings suspicious for right-sided
epididymitis and orchitis.

## 2013-02-17 ENCOUNTER — Emergency Department (HOSPITAL_COMMUNITY)
Admission: EM | Admit: 2013-02-17 | Discharge: 2013-02-17 | Disposition: A | Discharge status: Home / Self Care | Payer: Self-pay | Attending: Emergency Medicine | Admitting: Emergency Medicine

## 2013-02-17 ENCOUNTER — Encounter (HOSPITAL_COMMUNITY): Payer: Self-pay | Admitting: Emergency Medicine

## 2013-02-17 DIAGNOSIS — F329 Major depressive disorder, single episode, unspecified: Secondary | ICD-10-CM | POA: Insufficient documentation

## 2013-02-17 DIAGNOSIS — F1021 Alcohol dependence, in remission: Secondary | ICD-10-CM | POA: Insufficient documentation

## 2013-02-17 DIAGNOSIS — F3289 Other specified depressive episodes: Secondary | ICD-10-CM | POA: Insufficient documentation

## 2013-02-17 DIAGNOSIS — T401X4A Poisoning by heroin, undetermined, initial encounter: Secondary | ICD-10-CM | POA: Insufficient documentation

## 2013-02-17 DIAGNOSIS — T401X1A Poisoning by heroin, accidental (unintentional), initial encounter: Secondary | ICD-10-CM | POA: Insufficient documentation

## 2013-02-17 DIAGNOSIS — F17200 Nicotine dependence, unspecified, uncomplicated: Secondary | ICD-10-CM | POA: Insufficient documentation

## 2013-02-17 DIAGNOSIS — Y939 Activity, unspecified: Secondary | ICD-10-CM | POA: Insufficient documentation

## 2013-02-17 DIAGNOSIS — Y929 Unspecified place or not applicable: Secondary | ICD-10-CM | POA: Insufficient documentation

## 2013-02-17 DIAGNOSIS — Z79899 Other long term (current) drug therapy: Secondary | ICD-10-CM | POA: Insufficient documentation

## 2013-02-17 DIAGNOSIS — Z87448 Personal history of other diseases of urinary system: Secondary | ICD-10-CM | POA: Insufficient documentation

## 2013-02-17 DIAGNOSIS — J45909 Unspecified asthma, uncomplicated: Secondary | ICD-10-CM | POA: Insufficient documentation

## 2013-02-17 HISTORY — DX: Unspecified asthma, uncomplicated: J45.909

## 2013-02-17 LAB — BASIC METABOLIC PANEL
BUN: 10 mg/dL (ref 6–23)
Calcium: 8.8 mg/dL (ref 8.4–10.5)
Creatinine, Ser: 0.88 mg/dL (ref 0.50–1.35)
GFR calc Af Amer: >90 mL/min (ref >90)
GFR calc non Af Amer: >90 mL/min (ref >90)
Glucose, Bld: 123 mg/dL — ABNORMAL HIGH (ref 70–99)

## 2013-02-17 LAB — CBC
HCT: 41.9 % (ref 39.0–52.0)
Hemoglobin: 14.4 g/dL (ref 13.0–17.0)
MCH: 30.8 pg (ref 26.0–34.0)
MCHC: 34.4 g/dL (ref 30.0–36.0)
RDW: 12.9 % (ref 11.5–15.5)

## 2013-02-17 MED ORDER — IBUPROFEN 200 MG PO TABS
400.0000 mg | ORAL_TABLET | Freq: Once | ORAL | Status: AC
  Administered 2013-02-17: 400 mg via ORAL
  Filled 2013-02-17: qty 2

## 2013-02-17 NOTE — ED Notes (Addendum)
Per ems pt was front seat passenger of vehicle. ems was flagged down by pts friends. Pt was unresponsive, respirations 4/min, agonal braething. Pupils pinpoint. Friends report pt is into heroine. 2 mg Narcan IV.  18 g R AC. After seconds pt alert and oriented x4. Reports ETOH.  Pt reports headache.  Pt reports he is homeless.   At present pt alert and oriented x4. Tearful at bedside. [employer] at bedside.

## 2013-02-17 NOTE — ED Provider Notes (Signed)
CSN: [id number]     Arrival date & time 02/17/13  1857 History   First MD Initiated Contact with Patient 02/17/13 1903     Chief Complaint  Patient presents with  . Drug Overdose   (Consider location/radiation/quality/duration/timing/severity/associated sxs/prior Treatment) The history is provided by the patient and medical records. No language interpreter was used.    Robert Dougherty is a 22 y.o. male  with a hx of polysubstance abuse, asthma, alcohol abuse, depression presents to the Emergency Department via EMS after heroin overdose. EMS reports they were flagged down by his friends. Patient was found sitting in the front passenger seat of the car with agonal respirations. Patient given 2 mg of Narcan after which he regained consciousness and immediately complained of a headache.  Patient reports he did "dope" at a little after 6 PM. He reports he has overdosed on heroin in the past. Associated symptoms include headache.  Nothing makes it better and nothing makes it worse.  Pt denies fever, chills, neck pain, neck stiffness, chest pain, shortness of breath, abdominal pain, nausea, vomiting, diarrhea melena, dizziness.  Patient denies any infections to any of his injection sites.     Past Medical History  Diagnosis Date  . Epididymitis   . Depression   . Alcohol abuse   . Asthma    Past Surgical History  Procedure Laterality Date  . Nasal septum surgery     History reviewed. No pertinent family history. History  Substance Use Topics  . Smoking status: Current Every Day Smoker -- 0.50 packs/day    Types: Cigarettes  . Smokeless tobacco: Not on file  . Alcohol Use: No     Comment: Clean since 01/20/12    Review of Systems  Constitutional: Negative for fever, diaphoresis, appetite change, fatigue and unexpected weight change.  HENT: Negative for mouth sores.   Eyes: Negative for visual disturbance.  Respiratory: Negative for cough, chest tightness, shortness of breath and wheezing.    Cardiovascular: Negative for chest pain.  Gastrointestinal: Negative for nausea, vomiting, abdominal pain, diarrhea and constipation.  Endocrine: Negative for polydipsia, polyphagia and polyuria.  Genitourinary: Negative for dysuria, urgency, frequency and hematuria.  Musculoskeletal: Negative for back pain and neck stiffness.  Skin: Negative for rash.  Allergic/Immunologic: Negative for immunocompromised state.  Neurological: Positive for headaches. Negative for syncope and light-headedness.  Hematological: Does not bruise/bleed easily.  Psychiatric/Behavioral: Negative for sleep disturbance. The patient is not nervous/anxious.     Allergies  Review of patient's allergies indicates no known allergies.  Home Medications   Current Outpatient Rx  Name  Route  Sig  Dispense  Refill  . guaiFENesin (MUCINEX) 600 MG 12 hr tablet   Oral   Take 1,200 mg by mouth 2 (two) times daily.         Marland Kitchen ALPRAZolam (XANAX) 0.5 MG tablet   Oral   Take 1 mg by mouth 3 (three) times daily as needed for sleep.          BP 143/95  Pulse 88  Temp(Src) 97.5 F (36.4 C) (Oral)  Resp 18  SpO2 100% Physical Exam  Nursing note and vitals reviewed. Constitutional: He is oriented to person, place, and time. He appears well-developed and well-nourished. No distress.  Awake, alert, nontoxic appearance  HENT:  Head: Normocephalic and atraumatic.  Mouth/Throat: Oropharynx is clear and moist. No oropharyngeal exudate.  Eyes: Conjunctivae and EOM are normal. Pupils are equal, round, and reactive to light. No scleral icterus.  Neck: Normal range  of motion. Neck supple.  Cardiovascular: Normal rate, regular rhythm, normal heart sounds and intact distal pulses.   No murmur heard. RRR  Pulmonary/Chest: Effort normal and breath sounds normal. No respiratory distress. He has no wheezes. He has no rales.  Breath sounds clear and equal  Abdominal: Soft. Bowel sounds are normal. He exhibits no mass. There is  no tenderness. There is no rebound and no guarding.  Musculoskeletal: Normal range of motion. He exhibits no edema and no tenderness.  Lymphadenopathy:    He has no cervical adenopathy.  Neurological: He is alert and oriented to person, place, and time. He has normal reflexes. No cranial nerve deficit. He exhibits normal muscle tone. Coordination normal.  Speech is clear and goal oriented, follows commands Cranial nerves III - XII without deficit, no facial droop Normal strength in upper and lower extremities bilaterally, strong and equal grip strength Sensation normal to light and sharp touch Moves extremities without ataxia, coordination intact Normal finger to nose and rapid alternating movements Neg romberg, no pronator drift Normal gait Normal heel-shin and balance   Skin: Skin is warm and dry. No rash noted. He is not diaphoretic. No erythema.  No signs of cellulitis at injection sites  Psychiatric: He has a normal mood and affect. His behavior is normal. Judgment and thought content normal.    ED Course  Procedures (including critical care time) Labs Review Labs Reviewed  CBC - Abnormal; Notable for the following:    WBC 11.6 (*)    All other components within normal limits  BASIC METABOLIC PANEL - Abnormal; Notable for the following:    Glucose, Bld 123 (*)    All other components within normal limits  ETHANOL  URINE RAPID DRUG SCREEN (HOSP PERFORMED)   Imaging Review No results found.  EKG Interpretation     Ventricular Rate:  76 PR Interval:  178 QRS Duration: 104 QT Interval:  403 QTC Calculation: 453 R Axis:   -93 Text Interpretation:  Sinus rhythm Right superior axis Consider right ventricular hypertrophy t wave in V2 no longer inverted otherwise no significant change            MDM   1. Heroin overdose, initial encounter      Robert Dougherty presents after heroin overdose at approx 6pm tonight.  Pt presents alert and oriented, no respiratory  distress, no hypoxia.  Pt with hx of ploysubstance abuse.  Neurovascularly intact without neurologic deficit; doubt intracranial bleed. Will check basic labs and monitor.    9:55 PM Patient has been given food and ibuprofen with resolution of headache. UDS pending. Mild leukocytosis of 11.6 ethanol level less than 11 and BMP unremarkable. Patient without tachycardia or hypoxia here.    10:39 PM UDS unobtainable.  Pt urine sample lost in transit and he is able to produce another. Patient has remained alert and oriented without hypoxia here in the emergency department. He has not required stimulation or further Narcan.  Patient cautioned about heroin use and recommend primary care followup.  It has been determined that no acute conditions requiring further emergency intervention are present at this time. The patient/guardian have been advised of the diagnosis and plan. We have discussed signs and symptoms that warrant return to the ED, such as changes or worsening in symptoms.   Vital signs are stable at discharge.   BP 143/95  Pulse 88  Temp(Src) 97.5 F (36.4 C) (Oral)  Resp 18  SpO2 100%  Patient/guardian has voiced understanding and agreed  to follow-up with the PCP or specialist.       Dierdre Forth, PA-C 02/17/13 2240

## 2013-02-17 NOTE — ED Notes (Signed)
Bed: RESB Expected date:  Expected time:  Means of arrival:  Comments: Heroin OD 

## 2013-02-18 NOTE — ED Provider Notes (Signed)
Medical screening examination/treatment/procedure(s) were conducted as a shared visit with non-physician practitioner(s) and myself.  I personally evaluated the patient during the encounter.  EKG Interpretation     Ventricular Rate:  76 PR Interval:  178 QRS Duration: 104 QT Interval:  403 QTC Calculation: 453 R Axis:   -93 Text Interpretation:  Sinus rhythm Right superior axis Consider right ventricular hypertrophy t wave in V2 no longer inverted otherwise no significant change            I interviewed and examined the patient. Lungs are CTAB. Cardiac exam wnl. Abdomen soft.  Pt remained alert/oriented/ambulatory throughout his stay in the ED. Discharged w/ return precautions.   Junius Argyle, MD 02/18/13 1515

## 2013-08-12 IMAGING — US US ART/VEN ABD/PELV/SCROTUM DOPPLER LTD
1 series · 14 of 25 positions shown · non-contrast
Comparison: 11/22/2011 and earlier.

CLINICAL DATA: 21-year-old male with severe right testicular pain
times 2 days.

SCROTAL ULTRASOUND
DOPPLER ULTRASOUND OF THE TESTICLES
TECHNIQUE: Complete ultrasound examination of the testicles,
epididymis, and other scrotal structures was performed.  Color and
spectral Doppler ultrasound were also utilized to evaluate blood
flow to the testicles.

[Series 1: us art/ven abd/pelv/scrotum doppler ltd · 0.09mm/px · 14 of 36 slices shown]
[im 1/36]
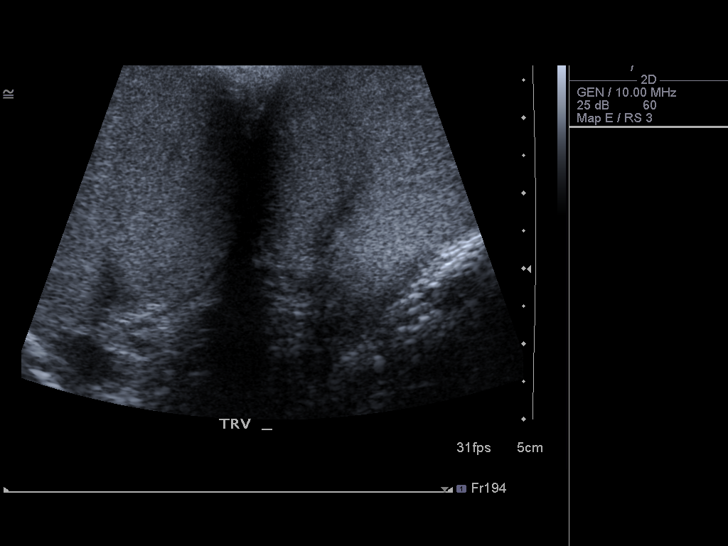
[im 3/36]
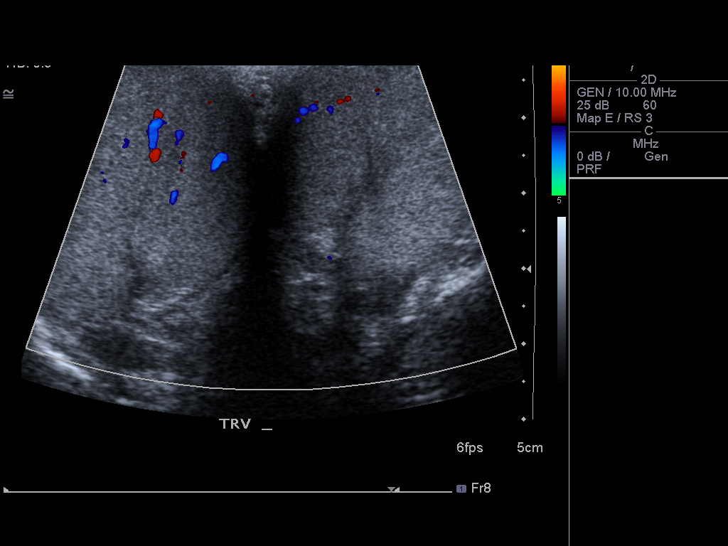
[im 6/36]
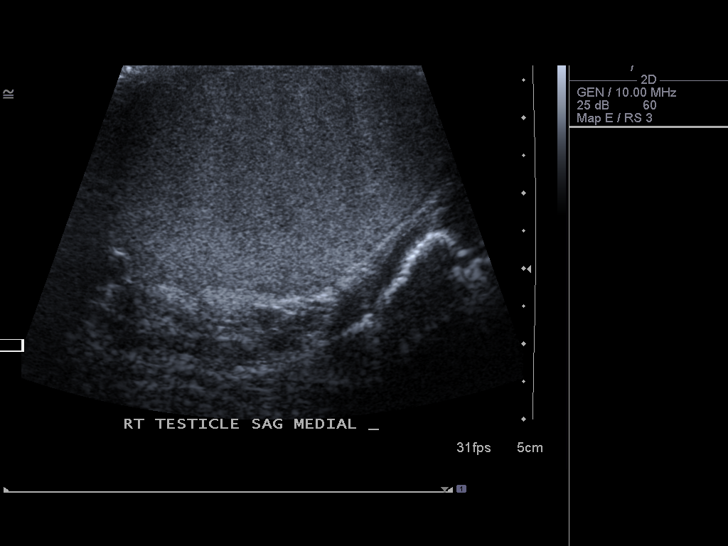
[im 9/36]
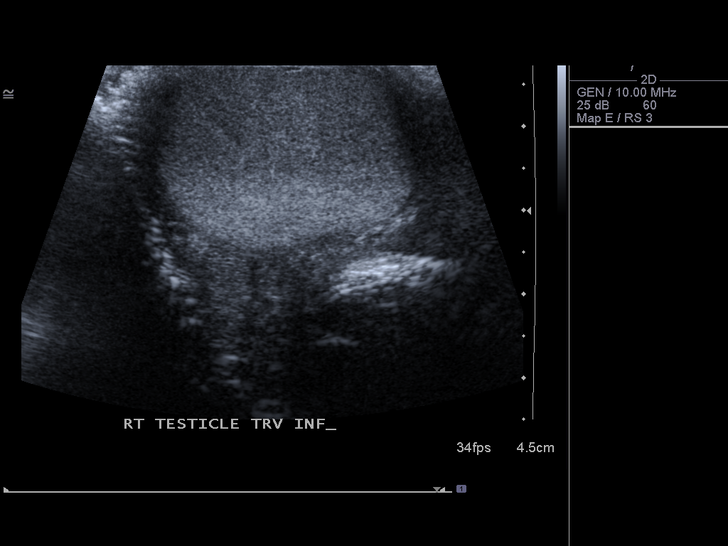
[im 12/36]
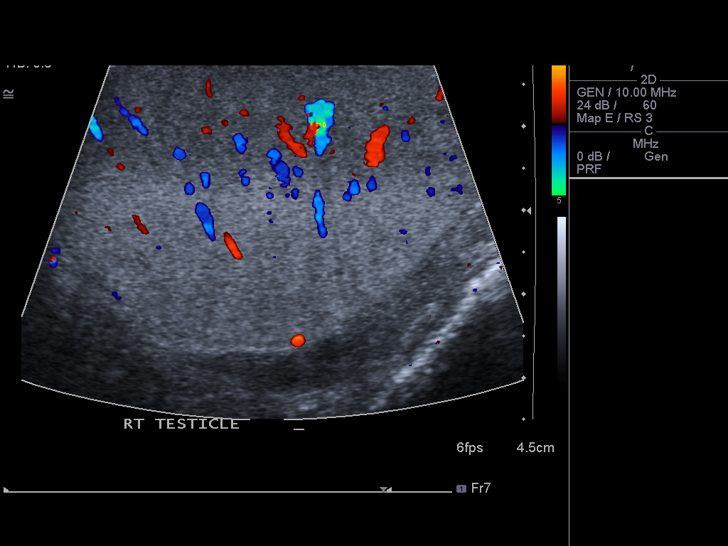
[im 14/36]
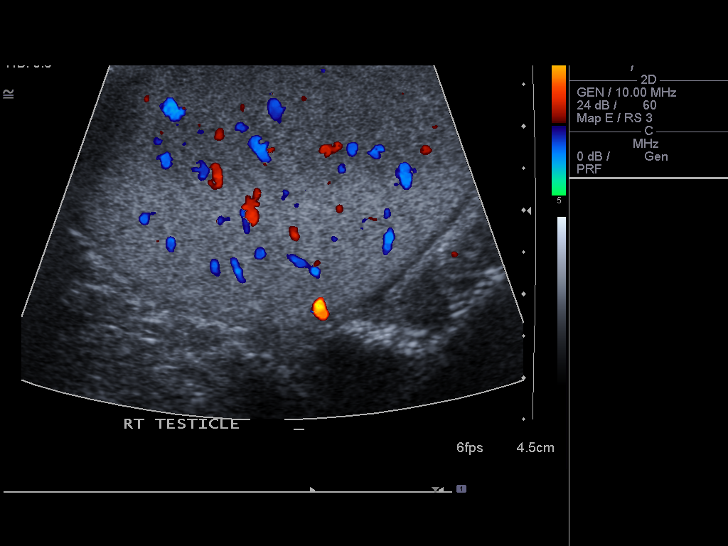
[im 17/36]
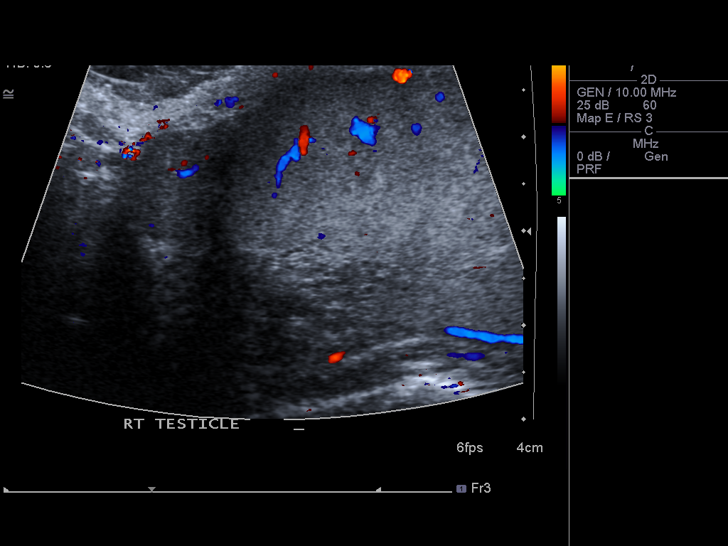
[im 19/36]
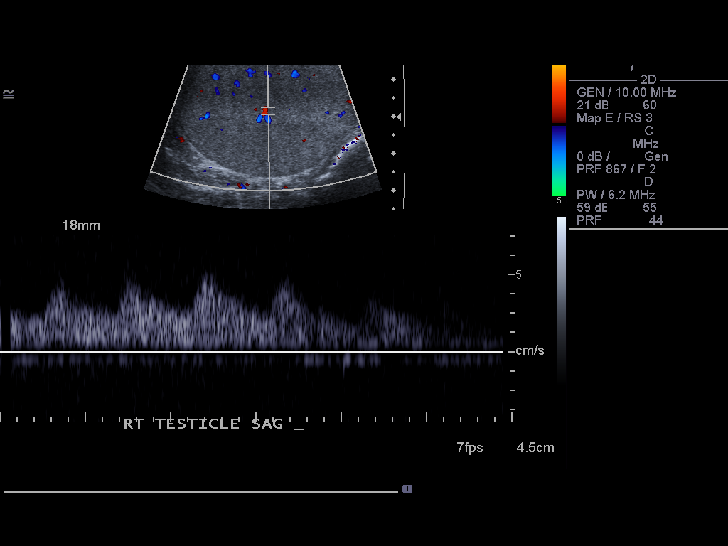
[im 22/36]
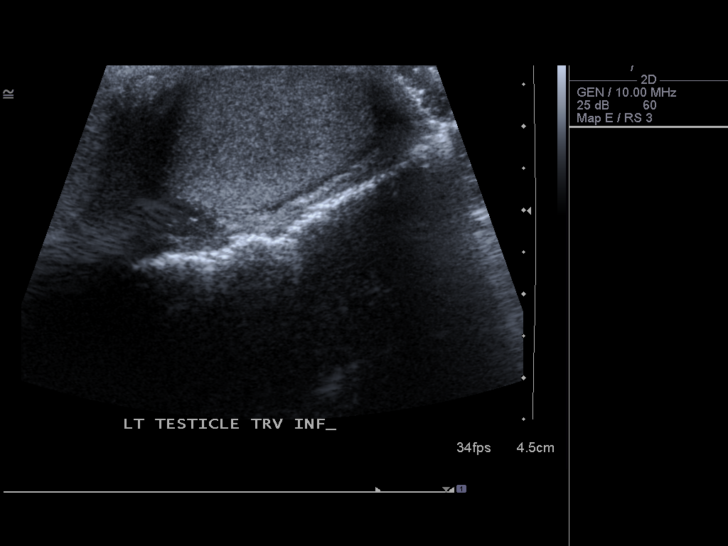
[im 24/36]
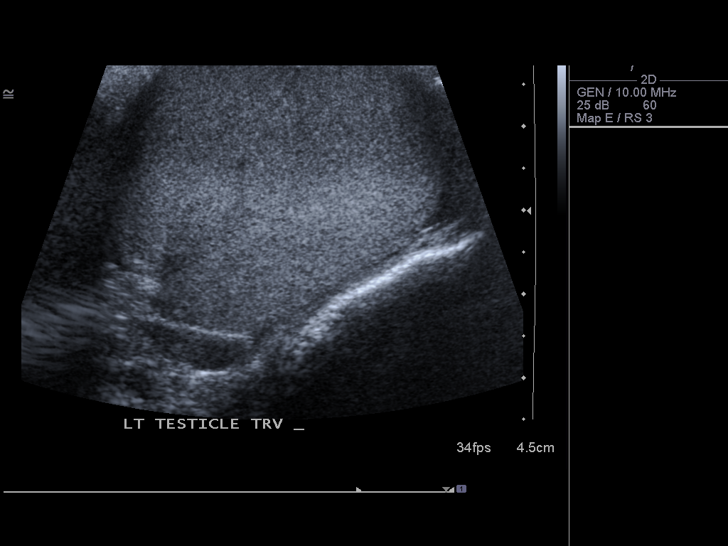
[im 27/36]
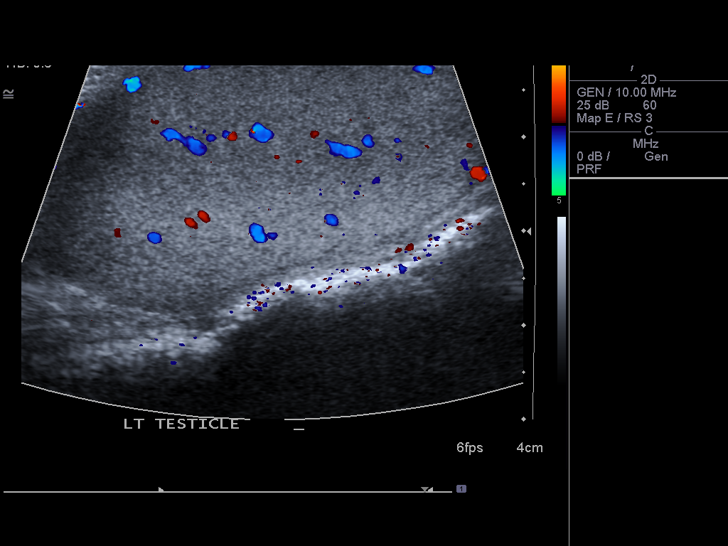
[im 30/36]
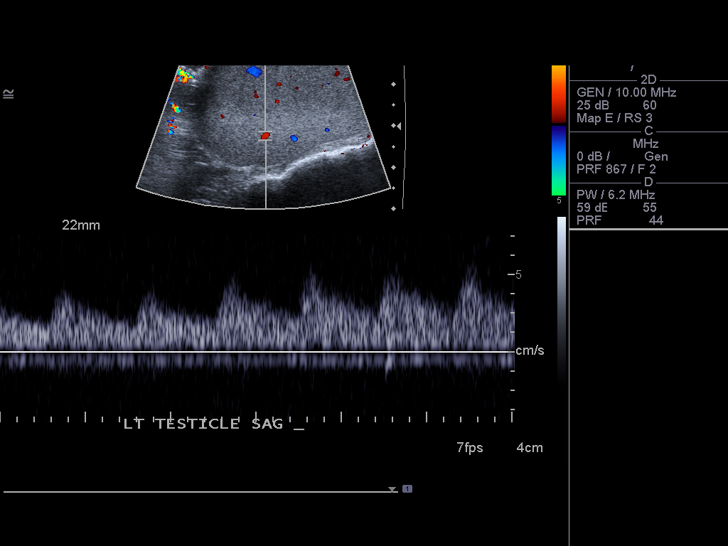
[im 33/36]
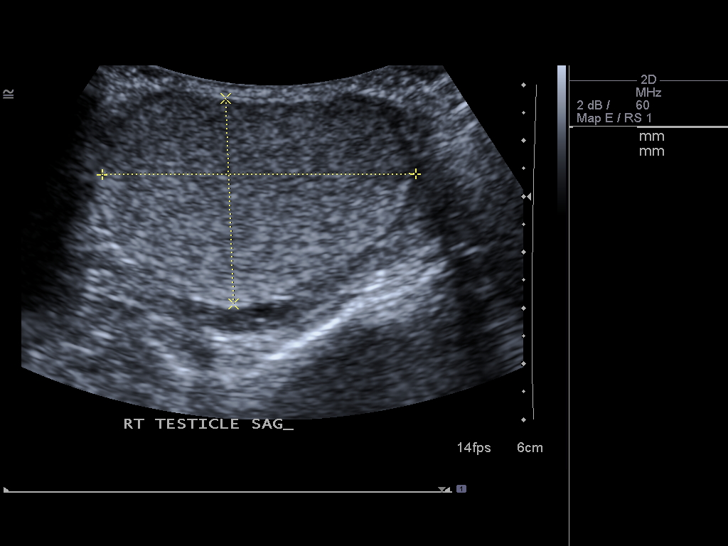
[im 36/36]
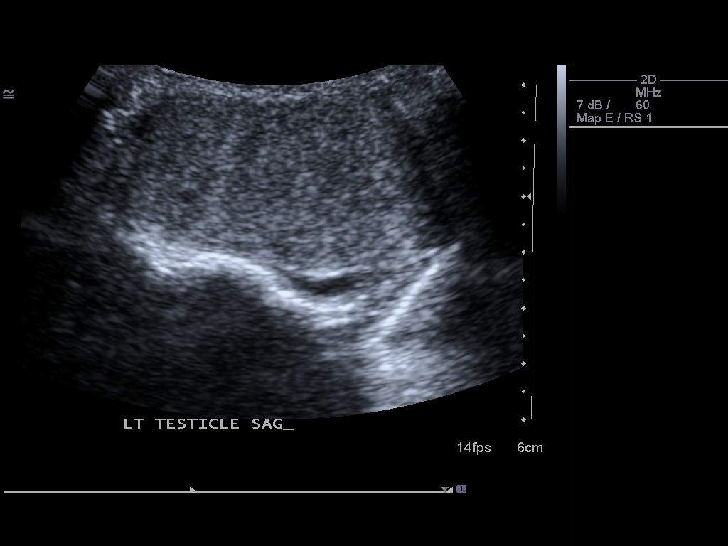

[14 of 25 positions shown; findings below may reference images not displayed]

FINDINGS: Right testis:  5.6 x 3.7 x 4 cm.  Echotexture appears stable within
normal limits.  No discrete testicular mass.

Left testis:  5 x 3.1 x 3.9 cm.  Echotexture appears stable and
within normal limits.  No testicular mass.

Right epididymis:  Normal in size and appearance.

Left epididymis:  Normal in size and appearance.

Hydrocele:  Trace on the left, decreased.

Varicocele:  Absent.

Pulsed Doppler interrogation of both testes demonstrates low
resistance flow bilaterally.Symmetric color Doppler flow noted.  No
areas of hypervascularity.
IMPRESSION: Negative scrotal ultrasound with Doppler.  No evidence of
testicular torsion.

## 2013-11-20 ENCOUNTER — Ambulatory Visit: Payer: Self-pay

## 2013-11-20 VITALS — BP 118/76 | HR 71 | Temp 98.1°F | Resp 16 | Ht 73.75 in | Wt 167.2 lb

## 2016-07-01 NOTE — Progress Notes (Unsigned)
Patient left after triage

## 2022-04-20 DIAGNOSIS — R69 Illness, unspecified: Secondary | ICD-10-CM | POA: Diagnosis not present

## 2022-06-16 DIAGNOSIS — F1120 Opioid dependence, uncomplicated: Secondary | ICD-10-CM | POA: Diagnosis not present

## 2022-06-16 DIAGNOSIS — F900 Attention-deficit hyperactivity disorder, predominantly inattentive type: Secondary | ICD-10-CM | POA: Diagnosis not present

## 2022-06-16 DIAGNOSIS — F419 Anxiety disorder, unspecified: Secondary | ICD-10-CM | POA: Diagnosis not present

## 2022-07-17 DIAGNOSIS — F1120 Opioid dependence, uncomplicated: Secondary | ICD-10-CM | POA: Diagnosis not present

## 2022-07-17 DIAGNOSIS — F900 Attention-deficit hyperactivity disorder, predominantly inattentive type: Secondary | ICD-10-CM | POA: Diagnosis not present

## 2022-08-16 DIAGNOSIS — F1120 Opioid dependence, uncomplicated: Secondary | ICD-10-CM | POA: Diagnosis not present

## 2022-08-16 DIAGNOSIS — F900 Attention-deficit hyperactivity disorder, predominantly inattentive type: Secondary | ICD-10-CM | POA: Diagnosis not present

## 2022-09-16 DIAGNOSIS — F1120 Opioid dependence, uncomplicated: Secondary | ICD-10-CM | POA: Diagnosis not present

## 2022-10-16 DIAGNOSIS — F1120 Opioid dependence, uncomplicated: Secondary | ICD-10-CM | POA: Diagnosis not present

## 2022-11-16 DIAGNOSIS — F1120 Opioid dependence, uncomplicated: Secondary | ICD-10-CM | POA: Diagnosis not present

## 2022-12-17 DIAGNOSIS — F1120 Opioid dependence, uncomplicated: Secondary | ICD-10-CM | POA: Diagnosis not present

## 2023-01-16 DIAGNOSIS — F1120 Opioid dependence, uncomplicated: Secondary | ICD-10-CM | POA: Diagnosis not present

## 2023-02-16 DIAGNOSIS — F1120 Opioid dependence, uncomplicated: Secondary | ICD-10-CM | POA: Diagnosis not present

## 2023-03-18 DIAGNOSIS — F1120 Opioid dependence, uncomplicated: Secondary | ICD-10-CM | POA: Diagnosis not present

## 2023-04-18 DIAGNOSIS — F419 Anxiety disorder, unspecified: Secondary | ICD-10-CM | POA: Diagnosis not present

## 2023-04-18 DIAGNOSIS — F1120 Opioid dependence, uncomplicated: Secondary | ICD-10-CM | POA: Diagnosis not present
